# Patient Record
Sex: Female | Born: 1960 | Race: White | Hispanic: No | Marital: Married | Smoking: Never smoker
Health system: Southern US, Community
[De-identification: ages and names within clinical notes are randomized; demographics above are authoritative.]

## PROBLEM LIST (undated history)

## (undated) DIAGNOSIS — E7800 Pure hypercholesterolemia, unspecified: Secondary | ICD-10-CM

## (undated) DIAGNOSIS — I639 Cerebral infarction, unspecified: Secondary | ICD-10-CM

## (undated) DIAGNOSIS — E039 Hypothyroidism, unspecified: Secondary | ICD-10-CM

## (undated) DIAGNOSIS — N6019 Diffuse cystic mastopathy of unspecified breast: Secondary | ICD-10-CM

## (undated) DIAGNOSIS — G44009 Cluster headache syndrome, unspecified, not intractable: Secondary | ICD-10-CM

## (undated) DIAGNOSIS — M314 Aortic arch syndrome [Takayasu]: Secondary | ICD-10-CM

## (undated) HISTORY — DX: Cluster headache syndrome, unspecified, not intractable: G44.009

## (undated) HISTORY — PX: OTHER SURGICAL HISTORY: SHX169

## (undated) HISTORY — DX: Aortic arch syndrome (takayasu): M31.4

## (undated) HISTORY — DX: Diffuse cystic mastopathy of unspecified breast: N60.19

## (undated) HISTORY — DX: Hypothyroidism, unspecified: E03.9

## (undated) HISTORY — DX: Pure hypercholesterolemia, unspecified: E78.00

## (undated) HISTORY — DX: Cerebral infarction, unspecified: I63.9

---

## 1999-08-03 ENCOUNTER — Ambulatory Visit (HOSPITAL_COMMUNITY): Admission: RE | Admit: 1999-08-03 | Discharge: 1999-08-03 | Payer: Self-pay | Admitting: Family Medicine

## 1999-08-03 ENCOUNTER — Encounter: Payer: Self-pay | Admitting: Family Medicine

## 2004-07-20 ENCOUNTER — Encounter: Admission: RE | Admit: 2004-07-20 | Discharge: 2004-07-20 | Payer: Self-pay | Admitting: Family Medicine

## 2004-07-29 ENCOUNTER — Encounter: Admission: RE | Admit: 2004-07-29 | Discharge: 2004-07-29 | Payer: Self-pay | Admitting: Family Medicine

## 2005-03-30 ENCOUNTER — Ambulatory Visit (HOSPITAL_COMMUNITY): Admission: RE | Admit: 2005-03-30 | Discharge: 2005-03-30 | Payer: Self-pay | Admitting: Family Medicine

## 2005-07-21 ENCOUNTER — Encounter: Admission: RE | Admit: 2005-07-21 | Discharge: 2005-07-21 | Payer: Self-pay | Admitting: Gastroenterology

## 2005-08-29 ENCOUNTER — Encounter: Admission: RE | Admit: 2005-08-29 | Discharge: 2005-08-29 | Payer: Self-pay | Admitting: Dermatology

## 2005-11-16 ENCOUNTER — Ambulatory Visit (HOSPITAL_COMMUNITY): Admission: RE | Admit: 2005-11-16 | Discharge: 2005-11-16 | Payer: Self-pay | Admitting: Family Medicine

## 2006-06-15 ENCOUNTER — Other Ambulatory Visit: Admission: RE | Admit: 2006-06-15 | Discharge: 2006-06-15 | Payer: Self-pay | Admitting: Family Medicine

## 2006-06-17 ENCOUNTER — Encounter: Admission: RE | Admit: 2006-06-17 | Discharge: 2006-06-17 | Payer: Self-pay | Admitting: Family Medicine

## 2007-07-24 ENCOUNTER — Inpatient Hospital Stay (HOSPITAL_COMMUNITY): Admission: EM | Admit: 2007-07-24 | Discharge: 2007-07-31 | Payer: Self-pay | Admitting: Emergency Medicine

## 2007-07-27 ENCOUNTER — Encounter (INDEPENDENT_AMBULATORY_CARE_PROVIDER_SITE_OTHER): Payer: Self-pay | Admitting: Internal Medicine

## 2009-02-04 DIAGNOSIS — M314 Aortic arch syndrome [Takayasu]: Secondary | ICD-10-CM

## 2009-02-05 ENCOUNTER — Ambulatory Visit: Payer: Self-pay | Admitting: Cardiovascular Disease

## 2009-04-30 ENCOUNTER — Ambulatory Visit: Payer: Self-pay | Admitting: Vascular Surgery

## 2010-05-06 ENCOUNTER — Other Ambulatory Visit
Admission: RE | Admit: 2010-05-06 | Discharge: 2010-05-06 | Payer: Self-pay | Source: Home / Self Care | Admitting: Family Medicine

## 2010-09-18 ENCOUNTER — Encounter: Payer: Self-pay | Admitting: Cardiovascular Disease

## 2010-09-21 ENCOUNTER — Ambulatory Visit (INDEPENDENT_AMBULATORY_CARE_PROVIDER_SITE_OTHER): Payer: 59 | Admitting: Cardiovascular Disease

## 2010-09-21 ENCOUNTER — Encounter: Payer: Self-pay | Admitting: Cardiovascular Disease

## 2010-09-21 DIAGNOSIS — Z7901 Long term (current) use of anticoagulants: Secondary | ICD-10-CM

## 2010-09-21 DIAGNOSIS — M314 Aortic arch syndrome [Takayasu]: Secondary | ICD-10-CM

## 2010-09-21 DIAGNOSIS — Z5181 Encounter for therapeutic drug level monitoring: Secondary | ICD-10-CM

## 2010-09-21 DIAGNOSIS — I739 Peripheral vascular disease, unspecified: Secondary | ICD-10-CM

## 2010-09-21 DIAGNOSIS — R0989 Other specified symptoms and signs involving the circulatory and respiratory systems: Secondary | ICD-10-CM

## 2010-09-21 NOTE — Assessment & Plan Note (Addendum)
Long standing occlusion of carotid bypass.  Seen by Dr Arbie Cookey in past with no surgical options.  Primary cerebral flow is vertebral.  No cardioembolic risk.  Will do duplex to reassess circulation.  I feel that antiplatlet Rx would be better at stroke prevention and less dangerous in the long run than coumadin or one of the newer Xa inhibitors.

## 2010-09-21 NOTE — Progress Notes (Signed)
Angla is referred back by Dr. Laurann Montana.  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  She has Ao-carotid bypasses  at [facility] in 87 and 89 and has been on chronic coumdan.  As I recall her bypasses are occludied and she relies on one vertibral for her CNS circulation.  She has been seen by Dr. Arbie Cookey who essentially told her, that he wouldn't touch her case and she would need to be referred to an academic center if her symptoms worsened.  She had a bout of headaches requiring hospitalizaiton in 07/2007.  Earlier this year she had some self limited palpitations.  She actually points to her neck area when she describes her palpitations.  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  She has not had any SSCP, syncope, TIA or dyspnea.  She does not have known involvement of the distal Ao or legs.  Her left arm is more affected and she has mild steal symptoms in this arm  Long discussion with her regarding her long term coumadin.  Also discussed pradaxa and xeralto.  I think antiplatlet therapy would be a better antithrombotic and stroke prevention since her carotid grafts are occluded.  ROS: Denies fever, malais, weight loss, blurry vision, decreased visual acuity, cough, sputum, SOB, hemoptysis, pleuritic pain, palpitaitons, heartburn, abdominal pain, melena, lower extremity edema, claudication, or rash.   General: Affect appropriate Healthy:  appears stated age HEENT: normal Neck supple with no adenopathy JVP normal bilateral bruits with CEA scars  no thyromegaly Lungs clear with no wheezing and good diaphragmatic motion Heart:  S1/S2 no murmur,rub, gallop or click PMI normal Abdomen: benighn, BS positve, no tenderness, no AAA no bruit.  No HSM or HJR Distal pulses intact with no bruits No edema Neuro non-focal Skin warm and dry No muscular weakness   Current  Outpatient Prescriptions  Medication Sig Dispense Refill  . Multiple Vitamin (MULTIVITAMIN) tablet Take 1 tablet by mouth daily.        . Nutritional Supplements (SOY PROTEIN SHAKE) POWD Take by mouth 2 (two) times daily.        Marland Kitchen thyroid (ARMOUR THYROID) 60 MG tablet Take 60 mg by mouth daily.        Marland Kitchen warfarin (COUMADIN) 10 MG tablet Take 10 mg by mouth as directed.        Marland Kitchen DISCONTD: aspirin 81 MG tablet Take 81 mg by mouth daily.          Allergies  Shellfish-derived products  Electrocardiogram:  NSR 76 normal ECG  Assessment and Plan

## 2010-09-21 NOTE — Patient Instructions (Signed)
Your physician recommends that you schedule a follow-up appointment in: one year  Your physician has requested that you have a carotid duplex. This test is an ultrasound of the carotid arteries in your neck. It looks at blood flow through these arteries that supply the brain with blood. Allow one hour for this exam. There are no restrictions or special instructions.   Your physician has requested that you have a upper extremity arterial duplex. This test is an ultrasound of the arteries in the legs or arms. It looks at arterial blood flow in the legs and arms. Allow one hour for Lower and Upper Arterial scans. There are no restrictions or special instructions

## 2010-09-21 NOTE — Assessment & Plan Note (Signed)
Coumadin followed at Dr Aram Beecham [facility] office.  Reviewed records and INR;s Rx.  Since carotid bypass grafts are occluded and primarily dealing with stroke prevention with no history of cardioembolic source, PAF, or hypercoagulability will discuss with [facility] and [facility] about changing to Plavix or Effient instead

## 2010-10-07 ENCOUNTER — Other Ambulatory Visit (INDEPENDENT_AMBULATORY_CARE_PROVIDER_SITE_OTHER): Payer: 59 | Admitting: *Deleted

## 2010-10-07 ENCOUNTER — Encounter (INDEPENDENT_AMBULATORY_CARE_PROVIDER_SITE_OTHER): Payer: 59 | Admitting: *Deleted

## 2010-10-07 ENCOUNTER — Encounter: Payer: 59 | Admitting: *Deleted

## 2010-10-07 DIAGNOSIS — I6529 Occlusion and stenosis of unspecified carotid artery: Secondary | ICD-10-CM

## 2010-10-07 DIAGNOSIS — R0989 Other specified symptoms and signs involving the circulatory and respiratory systems: Secondary | ICD-10-CM

## 2010-10-07 DIAGNOSIS — I739 Peripheral vascular disease, unspecified: Secondary | ICD-10-CM

## 2010-10-08 ENCOUNTER — Encounter: Payer: Self-pay | Admitting: Cardiovascular Disease

## 2010-10-12 NOTE — H&P (Signed)
Alyssa Yoder, Alyssa Yoder               ACCOUNT NO.:  [id number]   MEDICAL RECORD NO.:  [id number]          PATIENT TYPE:  INP   LOCATION:  1832                         FACILITY:  [facility]   PHYSICIAN:  Hollice Espy, M.D.DATE OF BIRTH:  1961/03/23   DATE OF ADMISSION:  07/24/2007  DATE OF DISCHARGE:                              HISTORY & PHYSICAL   PRIMARY CARE PHYSICIAN:  Bayla Mcgovern. White, M.D.   CHIEF COMPLAINT:  Headache.   HISTORY OF PRESENT ILLNESS:  The patient is a 50 year old white female  with a past medical history of Takayasu arteritis which is a chronic  autoimmune condition leading to inflammation of the aorta and its  surrounding branches which she has had in the past.  She has suffered  stenoses of her carotid artery leading to a bicarotid aorto-bypass as  well as leading to--she has only one patent vertebral posterior  circulation artery requiring chronic anticoagulation.  The patient has  otherwise been stable.  She had a brief episode of superior mesenteric  ischemia, but with collateral circulation, this did not require an  intervention.  Otherwise, she has been stable with no previous headaches  or severe flareups for the past 15 years.  However for the last 4 days,  the patient has had severe headaches described as how she previously  felt 15 years ago with severe pain in the top of her head and a pulsing  sensation occurring every 6 hours.  She was started on p.o. Darvocet  which initially helped with the pain.  She tried to wean herself off the  pain medication, but then her headaches returned severely today when she  tried to stop the pain medication all together.  The patient underwent a  CT scan of the head as an outpatient.  While we do not have access to  these films, the PCP passed on to the family that it was normal.  However, the headaches persisted and the patient came into the emergency  room today.  Her vitals are stable on admission.  When she came  in, she  had lab checks drawn, INR was found to be therapeutic.  Her white count  was found to be normal with no shift.  However, her transaminases were  mildly elevated.  The patient had passed on to the emergency room PA  that she had taken quite a bit of Percocet and Tylenol in the last few  days because of her headache.  There was a concern about the possibility  of too much Tylenol.  The patient currently complains of a headache and  photophobia.  She denies any vision changes or dysphagia.  No chest  pain, palpitations, shortness of breath, wheeze, cough.  No abdominal  pain.  No hematuria, dysuria, constipation, diarrhea, focal extremity  numbness, weakness or pain.   REVIEW OF SYSTEMS:  Otherwise negative.   PAST MEDICAL HISTORY:  1. Takayasu arteritis, status post aorto-bicarotid bypass, solo patent      vertebro-posterior circulation artery.  2. 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   MEDICATIONS:  1. Darvocet p.r.n.  2. Coumadin 10 p.o. q.h.s.   ALLERGIES:  SHELLFISH.   SOCIAL HISTORY:  No tobacco, alcohol or drug use.   FAMILY HISTORY:  Noncontributory.   PHYSICAL EXAMINATION:  VITAL SIGNS:  Temperature 98.4, heart rate 84,  blood pressure 117/82, respirations 20, O2 sat 100% on room air.  GENERAL:  She is alert and oriented x 3 in some mild distress secondary  to photophobia and headache.  HEENT:  Normocephalic, atraumatic.  Her mucous membranes are slightly  dry.  NECK:  She has absent carotid pulses.  HEART:  Regular rate and rhythm, S1 and S2.  LUNGS:  Clear to auscultation bilaterally.  ABDOMEN:  Soft, nontender and nondistended with positive bowel sounds.  EXTREMITIES:  Show no clubbing, cyanosis, or edema.   DIAGNOSTICS:  Again, we do not have access to her current CT scan.   LABORATORY DATA:  Her INR is therapeutic at 2.5, white count 8.9, H&H  14.8 and 43, MCV 87, platelet count 206 no shift, sodium 132, potassium  16,  chloride 96, bicarb 27, BUN 7, creatinine 0.6, glucose 108.  LFTs  are noted for a normal albumin of 4.4, but with an AST of 66, ALT  slightly elevated as well.  I have ordered a sed rate, CRP and Tylenol  level all of which are pending.   ASSESSMENT/PLAN:  1. Headache, await CRP and ESR, but suspect this is a flareup of      Takayasu arteritis.  Start with daily prednisone 60 mg.  The      patient has outpatient CT done.  We will discuss with her PCP.  If      this is not a CT angio, we will go ahead and order that here.  This      will give [place] a better evaluation of possible stenoses.  2. History of Takayasu arteritis, see above.  3. Chronic anticoagulation for solo vertebral artery, to keep it      patent, we will continue Coumadin.  4. Some mild increase in liver function tests in setting of recent      consumption of significant amounts of Tylenol, we will check a      Tylenol level.      Hollice Espy, M.D.  Electronically Signed     SKK/MEDQ  D:  07/24/2007  T:  07/24/2007  Job:  [id number]   cc:   Stacie Acres. Cliffton Asters, M.D.

## 2010-10-12 NOTE — Consult Note (Signed)
NAMEJAEDAN, Alyssa Yoder               ACCOUNT NO.:  [id number]   MEDICAL RECORD NO.:  [id number]          PATIENT TYPE:  INP   LOCATION:  3030                         FACILITY:  [facility]   PHYSICIAN:  Bevelyn Buckles. Champey, M.D.DATE OF BIRTH:  [birth year]   DATE OF CONSULTATION:  DATE OF DISCHARGE:                                 CONSULTATION   REQUESTING PHYSICIAN:  Dr. Janee Morn.   REASON FOR CONSULT:  Headache/aneurysm.   HISTORY OF PRESENT ILLNESS:  Alyssa Yoder is a 50 year old Caucasian female  with a past medical history of arteritis with a history of bilateral  carotid grafts and diseased posterior circulation requiring chronic  anticoagulation.  The patient has been stable for over 15 years without  any headaches or flares.  The patient did present to the hospital with a  few-day history of headache.  Her headaches are fairly severe, described  as throbbing, sharp, mostly on the top of the head, associated with  nausea, photophobia and phonophobia.  Her headache is now resolved with  pain medication/treatment.  Initially with that she had some difficulty  with speech, however, this is no longer an issue as her headache has  resolved.  She denies any current weakness, numbness, vision changes,  swallowing problems, chewing problems, vertigo, falls, or loss of  consciousness.  The patient did take fairly regularly Tylenol and  Percocet a few days prior to admission for her pain.  Her headache did  not completely resolve.   PAST MEDICAL HISTORY:  Is positive for arteritis, history of superior  mesenteric ischemia.   CURRENT MEDICATIONS:  Include Darvocet p.r.n., Coumadin, prednisone, and  Asacol.   ALLERGIES:  SHELLFISH.   FAMILY HISTORY:  Noncontributory.   SOCIAL HISTORY:  The patient denies any tobacco, alcohol, or drug use.   REVIEW OF SYSTEMS:  Positive as per HPI.  Negative in greater than 6  systems   REVIEW OF SYSTEMS:  Negative as per HPI  and greater than 6 other organ  systems.   PHYSICAL EXAMINATION:  VITAL SIGNS:  Temperature is 97.5, blood pressure  104/65, pulse is 64, respirations 20, 02 saturation is 100%.  HEENT:  Normocephalic, atraumatic.  Extraocular muscles are intact.  Face is symmetric.  HEART:  Regular.  LUNGS:  Clear.  ABDOMEN:  Soft.  EXTREMITIES:  Show good pulses.  NEUROLOGIC:  The patient is awake, alert and oriented.  Language is  fluent.  The patient is following commands verbally.  Cranial nerves 2-  12 are grossly intact.  Motor examination revealed 5/5 strength and  normal tone in all 4 extremities.  No drift is noted.  Sensory  examination is within normal to light touch.  Reflexes are 1-2+ and  symmetric.  Cerebellar function is within normal limits.  Gait was not  assessed secondary to safety.   LABORATORY DATA:  WBC is 11, hemoglobin is 14.6, hematocrit is 42.1,  platelets 186.  PT is 24.9, INR is 2.2.  Sodium is 134, potassium is  3.8, chloride is 100, and C02 is 26, BUN 9, creatinine 07, glucose 91.  AST is 49, ALT is  64.  Calcium is 9.  CRP is 0.2.  ESR is 5.  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  MRA of the neck showed absent flow in the  left subclavian, left ICA, and nonvisualization of the left vertebral  artery origin of the dominant right vertebral artery.   IMPRESSION:  This is a 50 year old Caucasian female with arteritis who  presents with a headache which is now completely resolved.  I reviewed  the MRI/MRA with radiology and patient.  Some of these findings seem old  and could be older chronic findings.  This patient knew several of the  abnormalities as discussed.  Will  try and get the old studies from the  [facility] the patient had several years ago.  I agree with the patient  on prednisone for now and I recommend slowly tapering the patient and  probably keep the patient on low-dose prednisone for her arteritis.  Her  symptoms are not completely resolved.  The patient might need a cerebral  angiogram depending on the results of her old studies.  This can be done  in the future.  Recommend continuing the patient on Coumadin and GI  prophylaxis.  The description of her headaches seems also suggestive of  migraine headaches.  If these continue or persist, will consider a  migraine preventive agent in the future.  I will follow the patient  while she is in the hospital.  This case was discussed with the primary  service.      Bevelyn Buckles. Nash Shearer, M.D.  Electronically Signed     DRC/MEDQ  D:  07/25/2007  T:  07/25/2007  Job:  [id number]

## 2010-10-12 NOTE — Discharge Summary (Signed)
Alyssa Yoder, Alyssa Yoder               ACCOUNT NO.:  [id number]   MEDICAL RECORD NO.:  [id number]          PATIENT TYPE:  INP   LOCATION:  3030                         FACILITY:  [facility]   PHYSICIAN:  Hollice Espy, M.D.DATE OF BIRTH:  02-22-1961   DATE OF ADMISSION:  07/23/2007  DATE OF DISCHARGE:  07/31/2007                               DISCHARGE SUMMARY   PRIMARY CARE PHYSICIAN:  Arnita Koons. White, MD   CONSULTANTS:  Pramod P. Pearlean Brownie, MD   DISCHARGE DIAGNOSES:  1. Refractive vascular headache.  2. History of  Takayasu's arteritis.   DISCHARGE MEDICATIONS:  1. Prednisone taper 50 mg August 01, 2007, then 40 mg p.o. on August 02, 2007 and decrease by 10 mg daily until finished.  2. Topamax 25 p.o. b.i.d.  3. Colace 100 p.o. b.i.d. while on Dilaudid.  4. Elavil 25 mg p.o. nightly.  5. Depakote 500 mg p.o. b.i.d.  6. Laxative of choice if no bowel movement x24 hours.  7. The patient will continue on Coumadin 10 mg daily.  8. She is also given a prescription for Dilaudid 2 mg 1 p.o. q.4 h.      p.r.n., total #50, 50 only.   DISCHARGE INSTRUCTIONS:  1. Discharge diet will be regular diet.  2. Activity will be as tolerated.  3. Followup appointment, she will followup with Dr. Delia Heady in      the next 1 week.  She will followup with Dr. Laurann Montana in the      next 1-2 weeks.   Overall disposition improved.   HOSPITAL COURSE:  The patient is a 50 year old white female with past  medical history of Takayasu's arteritis, who is found to have only 1  posterior circulation vertebral artery and requires chronic  anticoagulation.  She has actually been well without any flare ups for  the past 15 years, and then sort of having a severe, unrelenting  headache for 4 days.  She became concerned and came into the emergency  room.  In the emergency room she underwent a CT scan of the head that  showed no evidence of acute bleed or stroke.  However, given her severe  symptoms,  there is concern that whether or not this is a continued flare  up.  She was started on prednisone prophylactically, as well as had an  MRI/MRA done.  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  Dr. Clemetine Marker from neurology evaluated the patient, and after discussion  with radiology, as well as reviewing her old records from the [facility], he felt that her MRI findings were secondary to her arteritis,  however, he did not significantly feel that her headache was directly  attributed to this.  He then continued to watch her and  then after  discussion with the patient and further evaluation, felt that she more  likely had severe refractory headache, not necessarily migraine, perhaps  vascular, given the fact that she only had 1 patent vertebral artery.  He recommended continuing on prednisone, tapering off.  He added  Topamax, Elavail, and Depakote.  Over the next several days she  continued to improve and she was still requiring p.r.n. Dilaudid by pain  medication.  However, her episodes were starting to become more and more  intermittent and finally by July 30, 2007 she has had a full 24 hours  period where she did not have a headache.  At this time it was felt that  she was stable to go home.   The patient will be discharged on a prednisone taper.  She will continue  all of her other medications and have followup appointments as  scheduled.      Hollice Espy, M.D.  Electronically Signed     SKK/MEDQ  D:  07/31/2007  T:  07/31/2007  Job:  [id number]   cc:   Pramod P. Pearlean Brownie, MD  Stacie Acres. Cliffton Asters, M.D.

## 2010-10-20 ENCOUNTER — Telehealth: Payer: Self-pay | Admitting: Cardiology

## 2010-10-20 DIAGNOSIS — I771 Stricture of artery: Secondary | ICD-10-CM

## 2010-10-20 NOTE — Telephone Encounter (Signed)
Per Dr. [name], order put in for MRA of upper extremities, carotids, and intracerebral circulation. Patient will need BMP prior to test.

## 2010-10-20 NOTE — Telephone Encounter (Signed)
New order put in for MRA of head without contrast.

## 2010-10-21 ENCOUNTER — Other Ambulatory Visit: Payer: Self-pay | Admitting: *Deleted

## 2010-10-21 ENCOUNTER — Other Ambulatory Visit: Payer: Self-pay | Admitting: Cardiovascular Disease

## 2010-10-21 DIAGNOSIS — I708 Atherosclerosis of other arteries: Secondary | ICD-10-CM

## 2010-10-27 ENCOUNTER — Other Ambulatory Visit: Payer: Self-pay | Admitting: Cardiovascular Disease

## 2010-10-27 ENCOUNTER — Other Ambulatory Visit: Payer: Self-pay | Admitting: *Deleted

## 2010-10-27 DIAGNOSIS — R51 Headache: Secondary | ICD-10-CM

## 2010-10-27 DIAGNOSIS — R002 Palpitations: Secondary | ICD-10-CM

## 2010-10-28 ENCOUNTER — Inpatient Hospital Stay (HOSPITAL_COMMUNITY): Admission: RE | Admit: 2010-10-28 | Payer: 59 | Source: Ambulatory Visit

## 2010-12-13 ENCOUNTER — Telehealth: Payer: Self-pay | Admitting: Cardiovascular Disease

## 2010-12-13 NOTE — Telephone Encounter (Signed)
Pt will be running out of Coumadin in two weeks, pt also currently has no insurance. Pt is in the process of looking for insurance coverage. Pt was wondering if Dr. [name] can prescribe a different medication that has the same benefits that would be affordable w/o insurance. Pt is unable to come in for an appt due to lack of insurance. Dr. [name] was going to contact [facility]'s, they know most about the rare disease pt has that clogs up arteries.  Pt had to cancel procedures Dr. [name] ordered due to death in family and financial troubles.   Please return pt call to advise.

## 2010-12-13 NOTE — Telephone Encounter (Signed)
Left message for pt, will forward to dr [name] for his review and recommendations Alyssa Yoder

## 2010-12-14 NOTE — Telephone Encounter (Signed)
Coumadin is the cheapest anticoagulant we have.  But needs to be monitored

## 2010-12-14 NOTE — Telephone Encounter (Signed)
My note indicates antiplatelt may be better than coumadin.  See if she can take asa and generic plavix.  Stop coumadin if she can

## 2010-12-14 NOTE — Telephone Encounter (Signed)
Spoke with pt, she is on the coumadin to help with the circulation in her carotid bypass but they are now occluded. She wonders if she can come off the coumadin or if there is something else that will keep the arteries from being so sticky. She is having her coumadin followed. Will forward to dr Verdis Prime

## 2010-12-15 MED ORDER — CLOPIDOGREL BISULFATE 75 MG PO TABS: 75.0000 mg | ORAL_TABLET | Freq: Every day | ORAL | Status: DC

## 2010-12-15 NOTE — Telephone Encounter (Signed)
Spoke with pt, she would like to switch to asa and plavix. Script called to pharm. She will stop her coumadin and start the plavix Alyssa Yoder

## 2011-02-18 LAB — CBC
HCT: 42.1
HCT: 44
Hemoglobin: 13.4
MCHC: 34
MCHC: 34.6
MCV: 87
MCV: 88.4
Platelets: 162
Platelets: 229
RBC: 4.48
RBC: 4.76
RBC: 4.88
RBC: 4.98
RDW: 13
WBC: 11 — ABNORMAL HIGH
WBC: 11.5 — ABNORMAL HIGH
WBC: 8.9
WBC: 9.3

## 2011-02-18 LAB — PROTIME-INR
INR: 2.4 — ABNORMAL HIGH
INR: 2.5 — ABNORMAL HIGH
INR: 2.5 — ABNORMAL HIGH
INR: 4.3 — ABNORMAL HIGH
Prothrombin Time: 27.2 — ABNORMAL HIGH
Prothrombin Time: 27.7 — ABNORMAL HIGH
Prothrombin Time: 28 — ABNORMAL HIGH
Prothrombin Time: 35.3 — ABNORMAL HIGH
Prothrombin Time: 43.1 — ABNORMAL HIGH

## 2011-02-18 LAB — COMPREHENSIVE METABOLIC PANEL
BUN: 9
CO2: 26
CO2: 27
Calcium: 9
Calcium: 9.3
Chloride: 100
Chloride: 96
Creatinine, Ser: 0.63
Creatinine, Ser: 0.7
GFR calc Af Amer: >60
GFR calc non Af Amer: >60
GFR calc non Af Amer: >60
Glucose, Bld: 108 — ABNORMAL HIGH
Total Bilirubin: 0.6
Total Bilirubin: 0.8

## 2011-02-18 LAB — BASIC METABOLIC PANEL
BUN: 11
BUN: 9
Calcium: 9.3
Calcium: 9.4
Creatinine, Ser: 0.7
GFR calc Af Amer: >60
GFR calc Af Amer: >60
GFR calc non Af Amer: >60
GFR calc non Af Amer: >60
GFR calc non Af Amer: >60
Glucose, Bld: 94
Potassium: 3.9
Sodium: 137

## 2011-02-18 LAB — C4 COMPLEMENT: Complement C4, Body Fluid: 21

## 2011-02-18 LAB — C-REACTIVE PROTEIN: CRP: 0.2 — ABNORMAL LOW (ref <0.6)

## 2011-02-18 LAB — DIFFERENTIAL
Basophils Absolute: 0
Eosinophils Absolute: 0
Eosinophils Relative: 1
Lymphocytes Relative: 20
Lymphs Abs: 1.8
Neutrophils Relative %: 72

## 2011-02-18 LAB — COMPLEMENT, TOTAL: Compl, Total (CH50): 41 U/mL (ref 31–66)

## 2011-02-18 LAB — C3 COMPLEMENT: C3 Complement: 153

## 2011-02-18 LAB — METANEPHRINES, URINE, 24 HOUR: Volume, Urine-METAN: 3650

## 2011-02-18 LAB — CATECHOLAMINES, FRACTIONATED, URINE, 24 HOUR
Epinephrine 24 Hr Urine: 7 ug/24hr (ref <20)
Norepinephrine 24 Hr Urine: 55 ug/24hr (ref <80)
Total urine volume: 3650 mL

## 2011-02-18 LAB — VMA + CREATININE, URINE (TIMED COLLECTION): Volume, Urine-VMAUR: 3650

## 2011-02-18 LAB — ACETAMINOPHEN LEVEL: Acetaminophen (Tylenol), Serum: <10 — ABNORMAL LOW

## 2011-02-21 LAB — BASIC METABOLIC PANEL
BUN: 9
CO2: 30
Calcium: 9
Chloride: 95 — ABNORMAL LOW
GFR calc Af Amer: >60
GFR calc Af Amer: >60
GFR calc non Af Amer: >60
Potassium: 3.6
Potassium: 3.7
Sodium: 137
Sodium: 138

## 2011-02-21 LAB — CBC
HCT: 42
HCT: 44.3
Hemoglobin: 14.4
Hemoglobin: 15.1 — ABNORMAL HIGH
MCHC: 34.2
Platelets: 221
RBC: 4.74
RBC: 5.04
WBC: 16 — ABNORMAL HIGH

## 2011-02-21 LAB — PROTIME-INR
INR: 2.4 — ABNORMAL HIGH
Prothrombin Time: 21.8 — ABNORMAL HIGH

## 2011-10-13 ENCOUNTER — Telehealth: Payer: Self-pay | Admitting: Cardiovascular Disease

## 2011-10-13 NOTE — Telephone Encounter (Signed)
New Problem:     I called the patient and was unable to reach them. I left a message on their voicemail with my name, the reason I called, the name of his physician, and a number to call back to schedule their appointment. 

## 2011-12-22 ENCOUNTER — Other Ambulatory Visit: Payer: Self-pay | Admitting: Cardiovascular Disease

## 2012-08-10 ENCOUNTER — Other Ambulatory Visit: Payer: Self-pay | Admitting: Family Medicine

## 2012-08-10 DIAGNOSIS — Z1211 Encounter for screening for malignant neoplasm of colon: Secondary | ICD-10-CM

## 2012-10-08 ENCOUNTER — Ambulatory Visit
Admission: RE | Admit: 2012-10-08 | Discharge: 2012-10-08 | Disposition: A | Discharge status: Home / Self Care | Payer: BC Managed Care – PPO | Source: Ambulatory Visit | Attending: Family Medicine | Admitting: Family Medicine

## 2012-10-08 DIAGNOSIS — Z1211 Encounter for screening for malignant neoplasm of colon: Secondary | ICD-10-CM

## 2012-12-11 ENCOUNTER — Other Ambulatory Visit: Payer: Self-pay | Admitting: *Deleted

## 2012-12-11 MED ORDER — CLOPIDOGREL BISULFATE 75 MG PO TABS: ORAL_TABLET | ORAL | Status: DC

## 2013-04-26 ENCOUNTER — Ambulatory Visit (INDEPENDENT_AMBULATORY_CARE_PROVIDER_SITE_OTHER): Payer: BC Managed Care – PPO | Admitting: Cardiovascular Disease

## 2013-04-26 ENCOUNTER — Encounter: Payer: Self-pay | Admitting: Cardiovascular Disease

## 2013-04-26 ENCOUNTER — Encounter (INDEPENDENT_AMBULATORY_CARE_PROVIDER_SITE_OTHER): Payer: Self-pay

## 2013-04-26 ENCOUNTER — Encounter: Payer: Self-pay | Admitting: General Surgery

## 2013-04-26 VITALS — BP 110/84 | HR 76 | Ht 69.0 in | Wt 179.0 lb

## 2013-04-26 DIAGNOSIS — Z5181 Encounter for therapeutic drug level monitoring: Secondary | ICD-10-CM

## 2013-04-26 DIAGNOSIS — R079 Chest pain, unspecified: Secondary | ICD-10-CM

## 2013-04-26 DIAGNOSIS — R0600 Dyspnea, unspecified: Secondary | ICD-10-CM

## 2013-04-26 DIAGNOSIS — R0789 Other chest pain: Secondary | ICD-10-CM | POA: Insufficient documentation

## 2013-04-26 DIAGNOSIS — Z7901 Long term (current) use of anticoagulants: Secondary | ICD-10-CM

## 2013-04-26 DIAGNOSIS — M314 Aortic arch syndrome [Takayasu]: Secondary | ICD-10-CM

## 2013-04-26 DIAGNOSIS — R0609 Other forms of dyspnea: Secondary | ICD-10-CM

## 2013-04-26 NOTE — Patient Instructions (Signed)
Your physician recommends that you continue on your current medications as directed. Please refer to the Current Medication list given to you today.  I called Dr. Soundra Pilon office and left a message with their nurse stating we needed the pt to be seen ASAP. If you have not heard from them by Weds of next week please call our office.   Your physician has requested that you have an exercise tolerance test. For further information please visit [email or web address] Please also follow instruction sheet, as given.  Your physician has requested that you have an echocardiogram. Echocardiography is a painless test that uses sound waves to create images of your heart. It provides your doctor with information about the size and shape of your heart and how well your heart's chambers and valves are working. This procedure takes approximately one hour. There are no restrictions for this procedure.  Your physician wants you to follow-up in: 6 Months with Dr Haywood Filler will receive a reminder letter in the mail two months in advance. If you don't receive a letter, please call our office to schedule the follow-up appointment.

## 2013-04-26 NOTE — Assessment & Plan Note (Signed)
Continue antiplatlet Rx.  F/U Dr Arbie Cookey Suspect there is still no surgical intervention but may help with disability claim

## 2013-04-26 NOTE — Assessment & Plan Note (Signed)
Seems functional Normal lung and heart exam F/U echo

## 2013-04-26 NOTE — Progress Notes (Signed)
Patient ID: JO BOOZE, female   DOB: 11/29/1960, 52 y.o.   MRN: [id number] Alyssa Yoder is referred back by Dr. Laurann Montana. 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 She has Ao-carotid bypasses at [facility] in 89 and 93 and has been on chronic coumdan. 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 She has been seen by Dr. Arbie Cookey who essentially told her, that he wouldn't touch her case and she would need to be referred to an academic center if her symptoms worsened. She had a bout of headaches requiring hospitalizaiton in 07/2007. Earlier this year she had some self limited palpitations. She actually points to her neck area when she describes her palpitations. 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 She has not had any SSCP, syncope, TIA or dyspnea. She does not have known involvement of the distal Ao or legs. 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  2. 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  3. Dominant right vertebral artery as described above.    Has more fatigue and dyspnea. Feels head pounding. Unable to work going through disability process.  Would like to see Dr Early again.  No focal neurologic signs. Occasional chest pressure with dyspnea.    ROS: Denies fever, malais, weight loss, blurry vision, decreased visual acuity, cough, sputum, SOB, hemoptysis, pleuritic pain, palpitaitons, heartburn, abdominal pain, melena, lower extremity  edema, claudication, or rash.  All other systems reviewed and negative  General: Decreased BP in left arm and pulse in radial on left  Affect appropriate Healthy:  appears stated age HEENT: normal Neck supple with no adenopathy JVP normal bilateral bruits with surgical scars in neck no thyromegaly Lungs clear with no wheezing and good diaphragmatic motion Heart:  S1/S2 no murmur, no rub, gallop or click PMI normal Abdomen: benighn, BS positve, no tenderness, no AAA no bruit.  No HSM or HJR Distal pulses intact with no bruits No edema Neuro non-focal Skin warm and dry No muscular weakness   Current Outpatient Prescriptions  Medication Sig Dispense Refill  . aspirin 325 MG tablet Take 325 mg by mouth daily.      . clopidogrel (PLAVIX) 75 MG tablet TAKE 1 TABLET BY MOUTH EVERY DAY  30 tablet  10  . Multiple Vitamin (MULTIVITAMIN) tablet Take 1 tablet by mouth daily.        . Nutritional Supplements (SOY PROTEIN SHAKE) POWD Take by mouth 2 (two) times daily.        Marland Kitchen thyroid (ARMOUR THYROID) 60 MG tablet Take 60 mg by mouth daily.         No current facility-administered medications for this visit.    Allergies  Shellfish-derived products  Electrocardiogram:  SR Rate 76 normal   Assessment and Plan

## 2013-04-26 NOTE — Assessment & Plan Note (Signed)
Previous great vessel grafts known to be occluded Anticoagulation stopped and now on antiplatlet Rx

## 2013-04-26 NOTE — Assessment & Plan Note (Signed)
Normal ECG  F/U ETT no previously documented CAD.  Seems more related to ? Depression and issues with disability

## 2013-04-29 ENCOUNTER — Other Ambulatory Visit: Payer: Self-pay | Admitting: *Deleted

## 2013-04-29 DIAGNOSIS — M314 Aortic arch syndrome [Takayasu]: Secondary | ICD-10-CM

## 2013-05-10 ENCOUNTER — Ambulatory Visit (HOSPITAL_COMMUNITY)
Admission: RE | Admit: 2013-05-10 | Discharge: 2013-05-10 | Disposition: A | Discharge status: Home / Self Care | Payer: BC Managed Care – PPO | Source: Ambulatory Visit | Attending: Cardiovascular Disease | Admitting: Cardiovascular Disease

## 2013-05-10 DIAGNOSIS — R0609 Other forms of dyspnea: Secondary | ICD-10-CM

## 2013-05-10 DIAGNOSIS — R0600 Dyspnea, unspecified: Secondary | ICD-10-CM

## 2013-05-10 DIAGNOSIS — R079 Chest pain, unspecified: Secondary | ICD-10-CM

## 2013-05-10 DIAGNOSIS — I10 Essential (primary) hypertension: Secondary | ICD-10-CM | POA: Insufficient documentation

## 2013-05-13 ENCOUNTER — Encounter: Payer: Self-pay | Admitting: Cardiovascular Disease

## 2013-05-13 ENCOUNTER — Ambulatory Visit (HOSPITAL_COMMUNITY): Payer: BC Managed Care – PPO | Attending: Cardiovascular Disease | Admitting: Cardiology

## 2013-05-13 DIAGNOSIS — R079 Chest pain, unspecified: Secondary | ICD-10-CM

## 2013-05-13 DIAGNOSIS — R0600 Dyspnea, unspecified: Secondary | ICD-10-CM

## 2013-05-13 DIAGNOSIS — R0609 Other forms of dyspnea: Secondary | ICD-10-CM | POA: Insufficient documentation

## 2013-05-13 DIAGNOSIS — R0989 Other specified symptoms and signs involving the circulatory and respiratory systems: Secondary | ICD-10-CM | POA: Insufficient documentation

## 2013-05-13 DIAGNOSIS — R0602 Shortness of breath: Secondary | ICD-10-CM

## 2013-05-13 NOTE — Progress Notes (Signed)
Echo performed. 

## 2013-05-31 ENCOUNTER — Other Ambulatory Visit: Payer: Self-pay | Admitting: Vascular Surgery

## 2013-06-10 ENCOUNTER — Encounter: Payer: Self-pay | Admitting: Vascular Surgery

## 2013-06-11 ENCOUNTER — Ambulatory Visit (HOSPITAL_COMMUNITY)
Admission: RE | Admit: 2013-06-11 | Discharge: 2013-06-11 | Disposition: A | Discharge status: Home / Self Care | Payer: BC Managed Care – PPO | Source: Ambulatory Visit | Attending: Vascular Surgery | Admitting: Vascular Surgery

## 2013-06-11 ENCOUNTER — Encounter: Payer: Self-pay | Admitting: Vascular Surgery

## 2013-06-11 ENCOUNTER — Ambulatory Visit (INDEPENDENT_AMBULATORY_CARE_PROVIDER_SITE_OTHER): Payer: BC Managed Care – PPO | Admitting: Vascular Surgery

## 2013-06-11 VITALS — BP 110/76 | HR 68 | Resp 18 | Ht 68.0 in | Wt 182.5 lb

## 2013-06-11 DIAGNOSIS — M314 Aortic arch syndrome [Takayasu]: Secondary | ICD-10-CM | POA: Insufficient documentation

## 2013-06-11 NOTE — Progress Notes (Signed)
Patient name: Alyssa Yoder MRN: [id number] DOB: 06/06/1960 Sex: female   Referred by: Alyssa Yoder  Reason for referral:  Chief Complaint  Patient presents with  . New Evaluation    referred by Alyssa Yoder disease  involving upper extremities and carotids  . PVD    HISTORY OF PRESENT ILLNESS: The patient is a very pleasant 53 year old female who had seen on 2 prior occasions in 2005 in 2007. She has an extensive past medical history of Takayasu's disease. She had undergone extensive revascularization at the [facility]. I do not have these Alyssa Yoder that she had undergone bilateral carotid reconstruction [place] and redo surgery at the [facility] in 1993. She portion suffered an Alyssa Yoder reocclusion of work done in the [facility] after the 1993 procedure. 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 She doesn't have a known left subclavian artery occlusion. Majority of her intracranial circulation is via the right vertebral artery. Despite this she has remained stable for many years. She had been able to maintain usual activities up until approximately 5 years ago. Since that time she has had a severe difficulty with fatigue and headaches. Worse and she's had foot no focal neurologic deficits. 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 She does have good collateralization of flow to her mesenteric. She also portion has had no evidence of lower extremity arterial occlusive disease. Past Medical History  Diagnosis Date  . Takayasu's arteritis   . Mesenteric ischemia   . Stroke     Past Surgical History  Procedure Laterality Date  . Reconstruction of carotid arteries Bilateral 1989, 1993    History   Social History  . Marital Status: Married    Spouse Name: N/A    Number of Children: N/A  . Years of Education: N/A   Occupational History  . Not on file.   Social  History Main Topics  . Smoking status: Never Smoker   . Smokeless tobacco: Not on file  . Alcohol Use: No  . Drug Use: No  . Sexual Activity: Not on file   Other Topics Concern  . Not on file   Social History Narrative  . No narrative on file    History reviewed. No pertinent family history.  Allergies as of 06/11/2013 - Review Complete 06/11/2013  Allergen Reaction Noted  . Shellfish-derived products  09/21/2010    Current Outpatient Prescriptions on File Prior to Visit  Medication Sig Dispense Refill  . aspirin 325 MG tablet Take 325 mg by mouth daily.      . clopidogrel (PLAVIX) 75 MG tablet TAKE 1 TABLET BY MOUTH EVERY DAY  30 tablet  10  . Multiple Vitamin (MULTIVITAMIN) tablet Take 2 tablets by mouth daily.       . Nutritional Supplements (SOY PROTEIN SHAKE) POWD Take by mouth 2 (two) times daily.        Marland Kitchen thyroid (ARMOUR THYROID) 60 MG tablet Take 90 mg by mouth daily.        No current facility-administered medications on file prior to visit.     REVIEW OF SYSTEMS:  Positives indicated with an "X"  CARDIOVASCULAR:  [x ] chest pain   [ ]  chest pressure   [ ]  palpitations   [ ]  orthopnea   [ ]  dyspnea on exertion   [ ]  claudication   [ ]  rest pain   [ ]   DVT   [ ]  phlebitis PULMONARY:   [ ]  productive cough   [ ]  asthma   [ ]  wheezing NEUROLOGIC:   x] weakness  [ x] paresthesias  [ ]  aphasia  [x ] amaurosis  [x ] dizziness HEMATOLOGIC:   [ ]  bleeding problems   [ ]  clotting disorders MUSCULOSKELETAL:  [ ]  joint pain   [ ]  joint swelling GASTROINTESTINAL: [ ]   blood in stool  [ ]   hematemesis GENITOURINARY:  [ ]   dysuria  [ ]   hematuria PSYCHIATRIC:  [ ]  history of major depression INTEGUMENTARY:  [ ]  rashes  [ ]  ulcers CONSTITUTIONAL:  [ ]  fever   [ ]  chills  PHYSICAL EXAMINATION:  General: The patient is a well-nourished female, in no acute distress. Vital signs are BP 110/76  Pulse 68  Resp 18  Ht 5\' 8"  (1.727 m)  Wt 182 lb 8 oz (82.781 kg)  BMI 27.76  kg/m2 Pulmonary: There is a good air exchange bilaterally without wheezing or rales. Abdomen: Soft and non-tender with normal pitch bowel sounds. No bruits are noted Musculoskeletal: There are no major deformities.  There is no significant extremity pain. Neurologic: No focal weakness or paresthesias are detected, Skin: There are no ulcer or rashes noted. Psychiatric: The patient has normal affect. Cardiovascular: There is a regular rate and rhythm without significant murmur appreciated. Bilateral carotid incisions. L bruit on the right a soft bruit on the left. I do not hear subclavian bruits Pulse status 2+ right radial faint left radial pulse.  Palpable femoral popliteal and dorsalis pedis pulses bilaterally   Impression and Plan:  Complex patient with prior bilateral carotid reconstructions. A portion she has been able to maintain some quality-of-life. She suffers Alyssa Yoder fatigue in the afternoon and this requires her lying supine or approximate one hour I giving her some relief. She does have chronic headaches which are worse with exertion. I again explained that since she has not had any focal neurologic deficits, I certainly would not work recommend any invasive evaluation or treatment. She went to have limited ability for reconstruction due to her prior known occlusive disease in her extracranial cerebrovascular circulation she was reassured this discussion will see us again on an as-needed basis    Alyssa Yoder [facility] Office: [phone]

## 2013-10-10 ENCOUNTER — Other Ambulatory Visit: Payer: Self-pay

## 2013-10-10 MED ORDER — CLOPIDOGREL BISULFATE 75 MG PO TABS: ORAL_TABLET | ORAL | Status: DC

## 2013-10-29 DIAGNOSIS — Z0279 Encounter for issue of other medical certificate: Secondary | ICD-10-CM

## 2013-11-02 IMAGING — CT CT VIRTUAL COLONOSCOPY SCREENING
3 of 6 series · 13 of 36 positions shown, 19 images · non-contrast
Comparison: CT abdomen pelvis 08/29/2005 and 07/20/2004.

CLINICAL DATA: Screening, on Coumadin.  Constipation.

CT VIRTUAL COLONOSCOPY FOR SCREENING
TECHNIQUE: The patient was given a standard LoSo bowel
preparation with Gastrografin and barium for fluid and stool
tagging respectively.  The quality of the bowel preparation is
excellent.  Automated CO2 insufflation of the colon was performed
prior to image acquisition and colonic distention is excellent.
Image post processing was used to generate a 3D endoluminal fly-
through projection of the colon and to electronically subtract
stool/fluid as appropriate.

[Series 2: supine (id) · axial · 0.70mm/px · z∈[-462,-269]mm · 4 of 360 slices shown]
[im 52/360  soft-tissue]
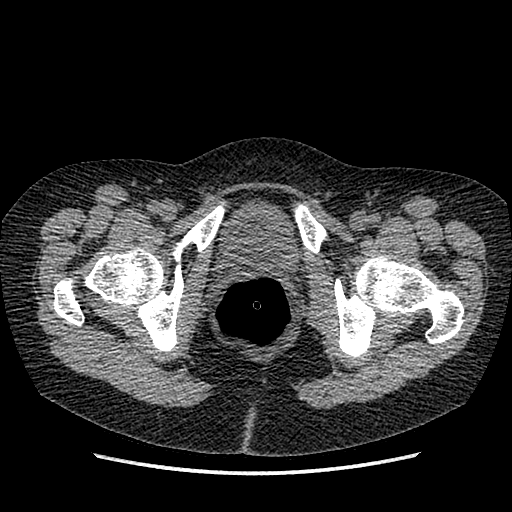
[im 103/360  soft-tissue]
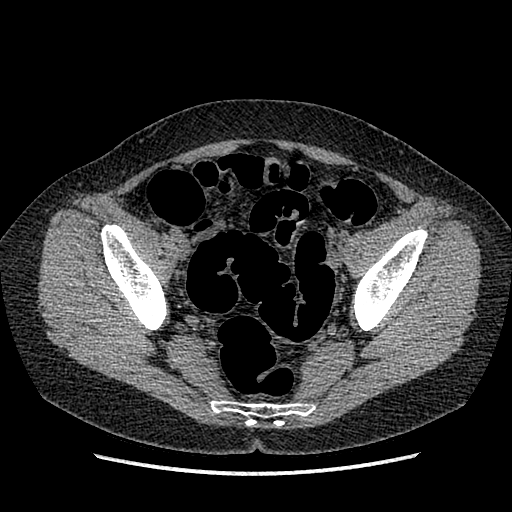
[im 154/360  soft-tissue]
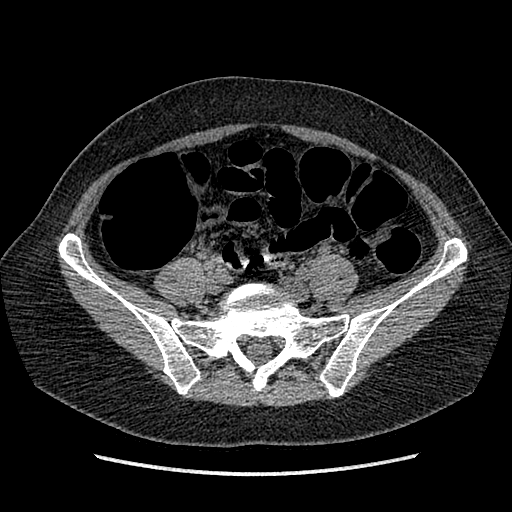
[im 206/360  soft-tissue]
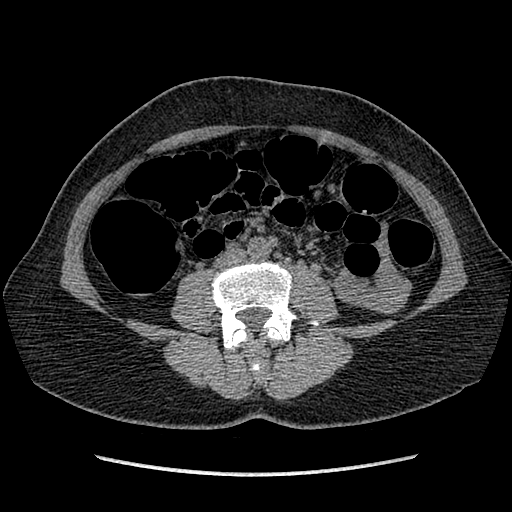

[Series 6: prone (id) · axial · 0.70mm/px · z∈[-484,-128]mm · 8 of 367 slices shown, 13 images]
[im 41/367  soft-tissue]
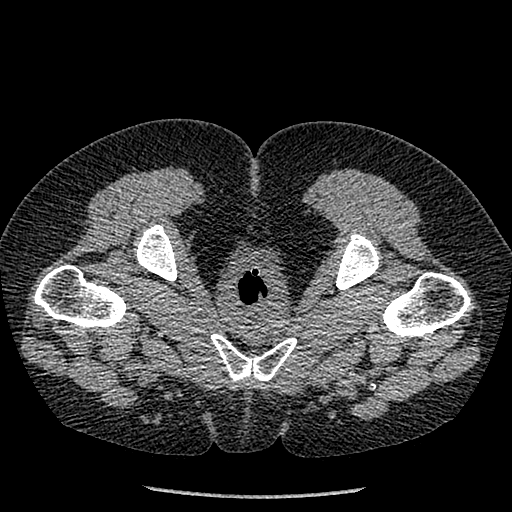
[im 41/367  bone]
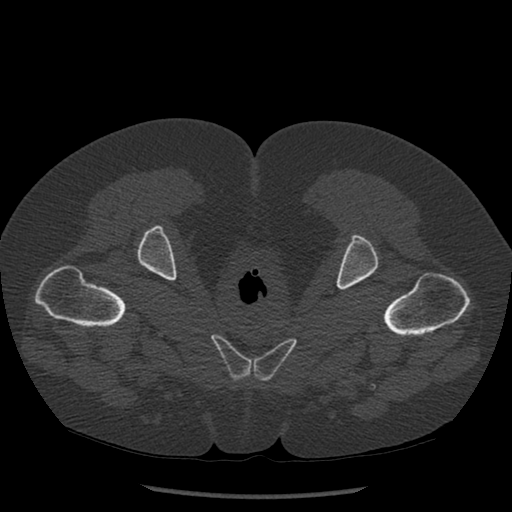
[im 82/367  soft-tissue]
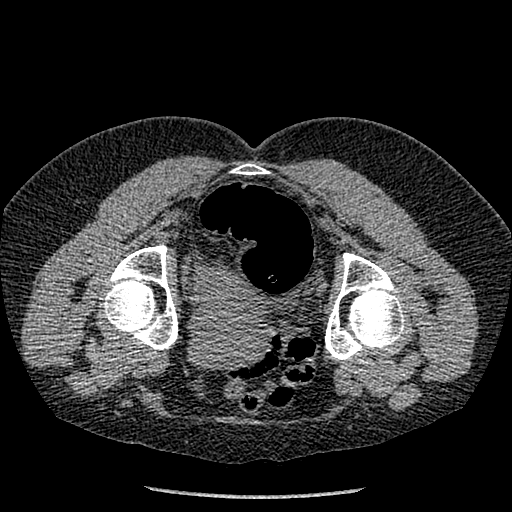
[im 123/367  soft-tissue]
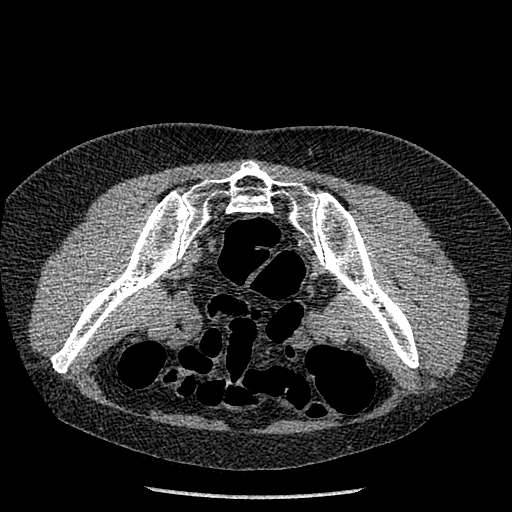
[im 163/367  soft-tissue]
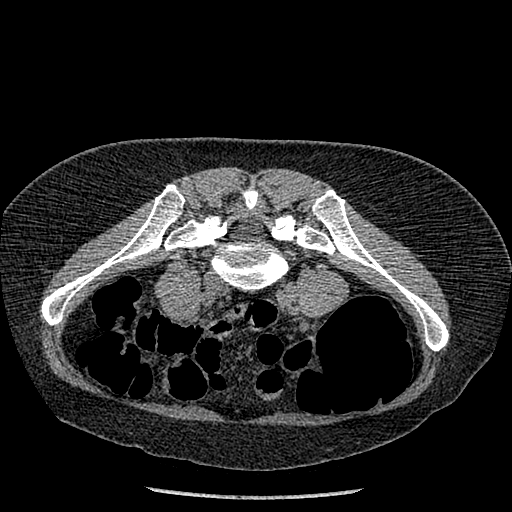
[im 204/367  soft-tissue]
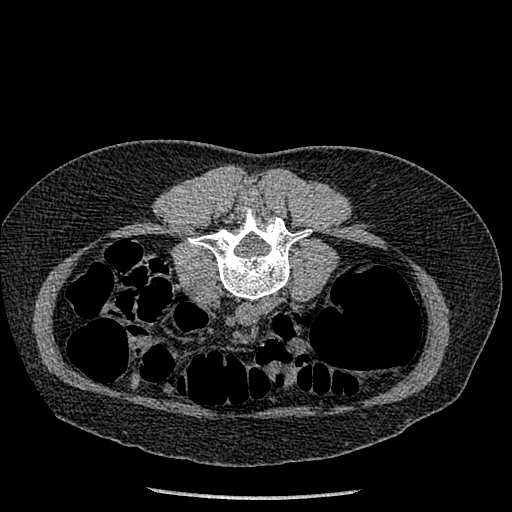
[im 204/367  lung]
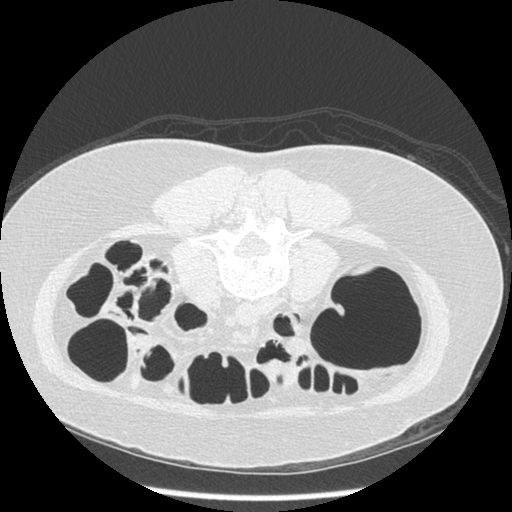
[im 245/367  soft-tissue]
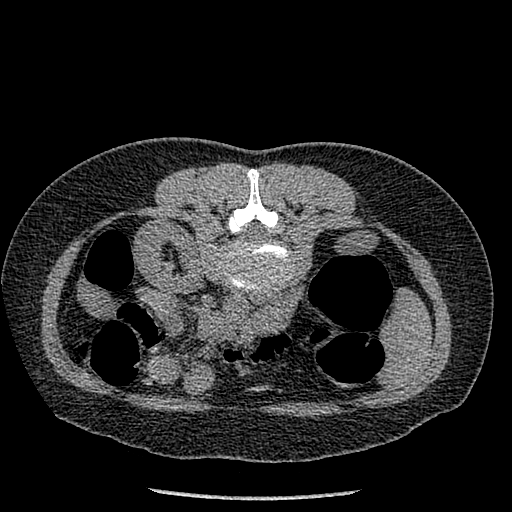
[im 245/367  lung]
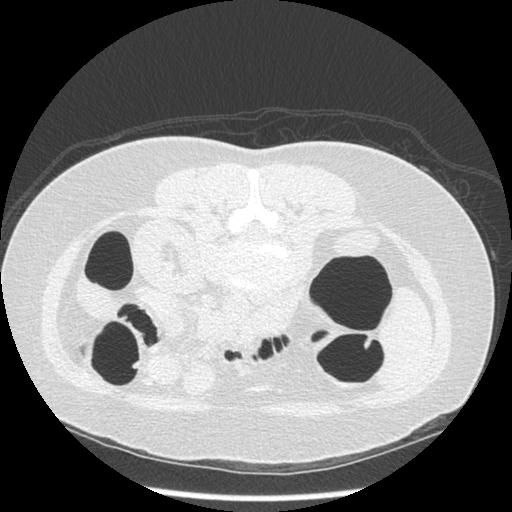
[im 285/367  soft-tissue]
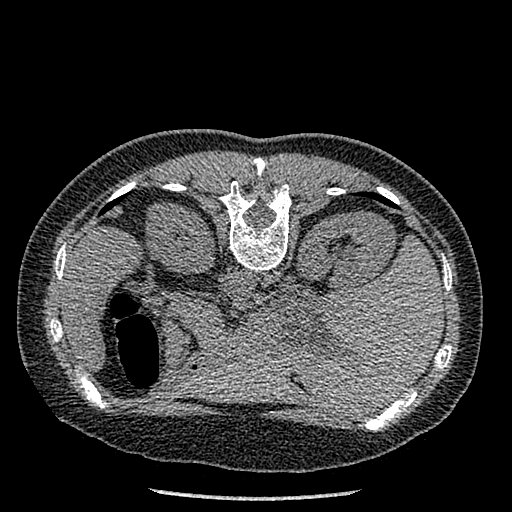
[im 285/367  lung]
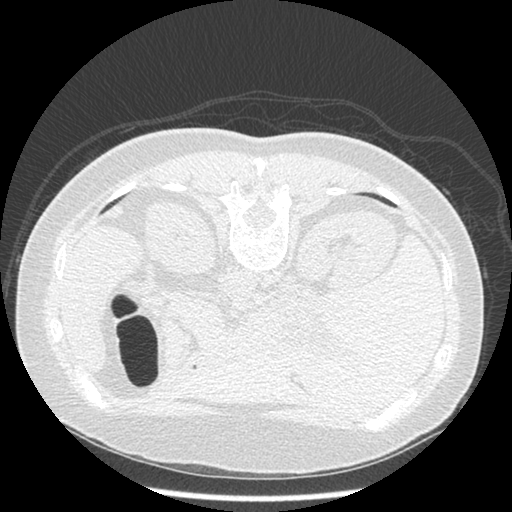
[im 326/367  soft-tissue]
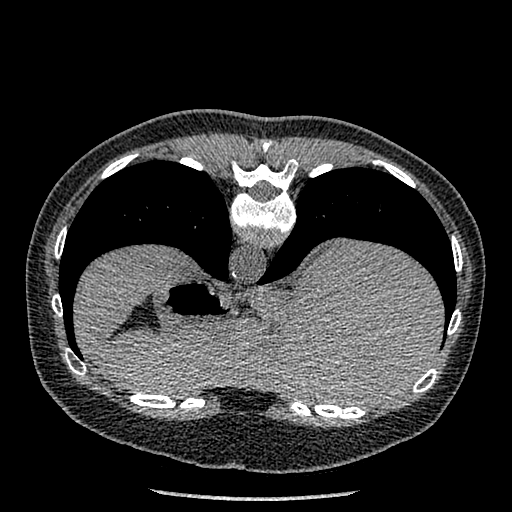
[im 326/367  lung]
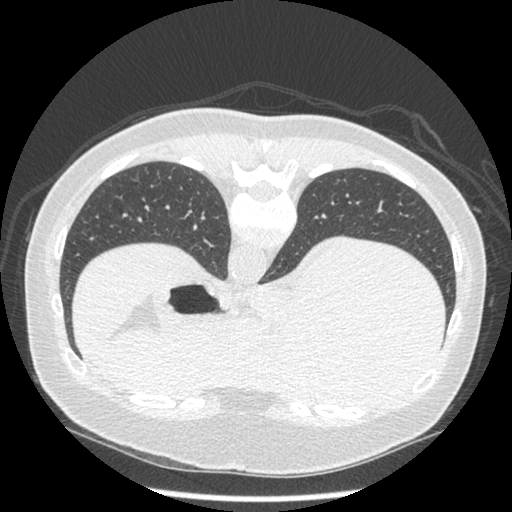

[Series 601: coronal body · coronal · 0.88mm/px · 1 of 112 slices shown, 2 images]
[im 38/112  soft-tissue]
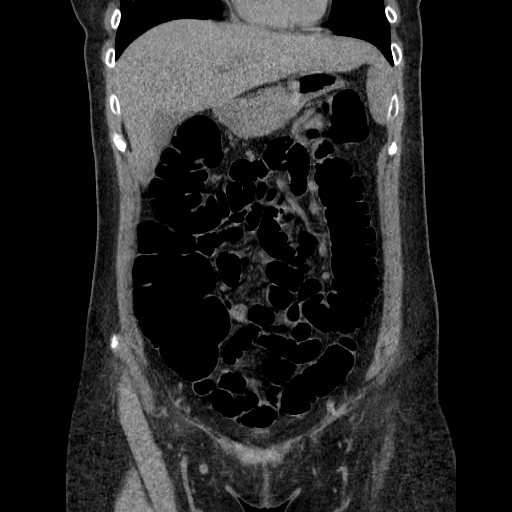
[im 38/112  bone]
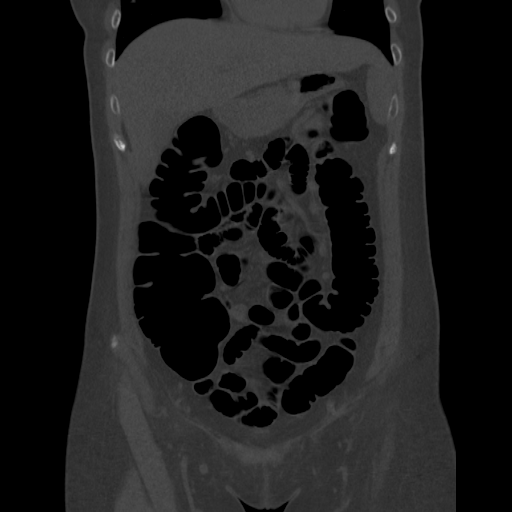

[13 of 36 positions shown; findings below may reference images not displayed]

FINDINGS: No polyp, stricture or mass.
IMPRESSION: No polyp, stricture or mass.

Virtual colonoscopy is not designed to detect diminutive polyps
(i.e., less than or equal to 5 mm), the presence or absence of
which may not affect clinical management.

CT ABDOMEN AND PELVIS WITHOUT CONTRAST
FINDINGS: Lung bases show clustered nodularity in the lateral
right middle lobe, likely post infectious in etiology.  Lung bases
are otherwise unremarkable.  No pericardial or pleural effusion.

Liver, gallbladder, adrenal glands, kidneys, spleen, pancreas,
stomach and small bowel are unremarkable.  Colon is discussed in
the virtual colonoscopy section of this report.  Atherosclerotic
calcification of the arterial vasculature without abdominal aortic
aneurysm.  No pathologically enlarged lymph nodes.  No free fluid.
No worrisome lytic or sclerotic lesions.
IMPRESSION: No acute findings.

## 2014-06-18 ENCOUNTER — Encounter: Payer: Self-pay | Admitting: *Deleted

## 2014-06-19 ENCOUNTER — Ambulatory Visit (INDEPENDENT_AMBULATORY_CARE_PROVIDER_SITE_OTHER): Payer: No Typology Code available for payment source | Admitting: Neurology

## 2014-06-19 ENCOUNTER — Encounter: Payer: No Typology Code available for payment source | Admitting: Radiology

## 2014-06-19 ENCOUNTER — Encounter: Payer: Self-pay | Admitting: Neurology

## 2014-06-19 VITALS — BP 105/72 | HR 79 | Ht 68.0 in | Wt 188.0 lb

## 2014-06-19 DIAGNOSIS — G441 Vascular headache, not elsewhere classified: Secondary | ICD-10-CM

## 2014-06-19 DIAGNOSIS — I639 Cerebral infarction, unspecified: Secondary | ICD-10-CM

## 2014-06-19 DIAGNOSIS — R5382 Chronic fatigue, unspecified: Secondary | ICD-10-CM

## 2014-06-19 DIAGNOSIS — R5383 Other fatigue: Secondary | ICD-10-CM | POA: Insufficient documentation

## 2014-06-19 MED ORDER — ROSUVASTATIN CALCIUM 5 MG PO TABS: 2.5000 mg | ORAL_TABLET | Freq: Every day | ORAL | Status: DC

## 2014-06-19 MED ORDER — MODAFINIL 100 MG PO TABS: 100.0000 mg | ORAL_TABLET | Freq: Every day | ORAL | Status: DC

## 2014-06-19 NOTE — Patient Instructions (Signed)
I had a long discussion with the patient was regards to symptoms of chronic fatigue, discussed differential diagnosis, plan for evaluation and answered questions. Check EMG nerve conduction study with rapid repetitive stimulation for myasthenia as well as acetylcholine receptor antibodies. Trial of Provigil 100 mg at known to see if it helps her fatigue. She was advised to take Tylenol Extra Strength or ibuprofen for symptomatic relief of her intermittent vascular headaches. If this is not effective may try low-dose narcotics later. I have specifically advised her to stay away from trip pounds and vasoconstrictive headache medications even her complex neurovascular status. I also advised her to start taking Crestor 2.5 mg daily for her elevated LDL. Continue Plavix for stroke prevention. Unfortunately she is not a candidate for further neurovascular surgery reconstruction given her prior complicated Takayashu`s disease with failed reconstructive surgery x 2. Chronic Fatigue Syndrome Chronic fatigue syndrome (CFS) is a condition in which there is lasting, extreme tiredness (fatigue) that does not improve with rest. CFS affects women up to four times more often than men. If you have CFS, fatigue and other symptoms can make it hard for you to get through your day. There is no treatment or cure. You will need to work closely with your health care provider to come up with a treatment plan that works for you. CAUSES  No one knows what causes CFS. It may be triggered by a flu-like illness or by mono. Other triggers may include:  An abnormal immune system.  Low blood pressure.  Poor diet.  Physical or emotional stress. SIGNS AND SYMPTOMS The main symptom is fatigue that lasts all day, especially after physical or mental stress. Other common symptoms include:  An extreme loss of energy with no obvious cause.  Muscle or joint soreness.  Severe weakness.  Frequent headaches.  Fever.  Sore  throat.  Swollen lymph glands.  Sleep is not refreshing.  Loss of concentration or memory. Less common symptoms may include:  Chills.  Night sweats.  Tingling or numbness.  Blurred vision.  Dizziness.  Sensitivity to noise or odors.  Mood swings.  Anxiety, panic attacks, and depression. Your symptoms may come and go, or you may have them all the time. DIAGNOSIS  There are no tests that can help health care providers diagnose CFS. It may take a long time for you to get a correct diagnosis. Your health care provider may need to do a number of tests to rule out other conditions that could be causing your symptoms. You may be diagnosed with CFS if:  You have fatigue that has lasted for at least six months.  Your fatigue is not relieved by rest.  Your fatigue is not caused by another condition.  Your fatigue is severe enough to interfere with work and daily activities.  You have at least four common symptoms of CFS. TREATMENT  There is no cure for CFS at this time. The condition affects everyone differently. You will need to work with your health care provider to find the best treatment for your symptoms. Treatment may include:  Improving sleep with a regular bedtime routine.  Avoiding caffeine, alcohol, and tobacco.  Doing light exercise and stretching during the day.  Taking medicine to help you sleep.  Taking over-the-counter medicines to relieve joint or muscle pain.  Learning and practicing relaxation techniques.  Using memory aids or doing brain teasers to improve memory and concentration.  Seeing a mental health professional to evaluate and treat depression, if necessary.  Trying  massage therapy, acupuncture, and movement exercises, like yoga or tai chi. [place] Work closely with your health care provider to follow your treatment plan at home. You may need to make major lifestyle changes. If treatment does not seem to help, get a second  opinion. You may get help from many health care providers, including doctors, mental health specialists, physical therapists, and rehabilitation therapists. Having the support of friends and loved ones is also important. SEEK MEDICAL CARE IF:  Your symptoms are not responding to treatment.  You are having strong feelings of anger, guilt, anxiety, or depression. Document Released: 06/23/2004 Document Revised: 09/30/2013 Document Reviewed: 04/05/2013 [facility] Patient Information 2015 [place], [place]. This information is not intended to replace advice given to you by your health care provider. Make sure you discuss any questions you have with your health care provider.

## 2014-06-19 NOTE — Progress Notes (Signed)
[facility] [address] [place]. [place] [address] [phone]       OFFICE CONSULT NOTE  [name] Date of Birth:  05-01-1961 Medical Record Number:  [id number]   Referring MD:  Laurann Montana, MD  Reason for Referral:  Headaches  HPI: 54 year old Caucasian lady with a comminuted past medical history of back issues arteritis with failed carotid bypass surgery in 1989 done in [place] followed by repeat surgery with reconstruction using posterior headache in 1993 at [facility] which also reoccluded in 1996. She had a small stroke with some mild 70% left vertebral artery occlusion and dilated and large right vertebral artery which is the dominant supply to the brain. She has had intermittent symptoms of dizziness with neck extension since then but has not had any syncopal episodes of further stroke or neurovascular symptoms. She has had had symptoms of chronic fatigue and tiredness which seem to be more prominent towards the afternoon and evening time. She was seen by me in 2009 for the above symptoms and was lost to follow-up. In October 2015 she started having intermittent sharp jabbing pains involving the left face and temples which occurred on a variable frequency. She also has a dull pressure-like right occipital headache as well. Both these symptoms seem to be more aggravated with exertion as well as towards late afternoon and evening. The symptoms are relieved by lying down. Rarely she needs to take Tylenol or Motrin which does help. She has in fact learned to do most of her activities in the morning time. She does not in fact when driving the evening and spends October 2 day resting. She was recently evaluated by vascular surgeon Dr. Arbie Cookey who felt no revascularization options were remaining and recommended conservative treatment. She takes an aspirin as well as takes soy protein and supplements which helped with her energy level. She denies any drooping of  eyelids, diplopia, trouble swallowing but does say that her eyes group was another day and her arms and shoulders feel heavy and weak and she has excessive fatigue.  ROS:   14 system review of systems is positive for weight gain, fatigue, palpitations, leg swelling, trouble swallowing, memory loss, headache, weakness, feeling hot, runny nose and all systems negative   PMH:  Past Medical History  Diagnosis Date  . Takayasu's arteritis   . Mesenteric ischemia   . Stroke   . Fibrocystic breast disease   . Cluster headaches   . Hypothyroidism   . Hypercholesterolemia     Social History:  History   Social History  . Marital Status: Married    Spouse Name: N/A    Number of Children: 2  . Years of Education: 12+   Occupational History  . Not on file.   Social History Main Topics  . Smoking status: Never Smoker   . Smokeless tobacco: Never Used  . Alcohol Use: No  . Drug Use: No  . Sexual Activity: Not on file   Other Topics Concern  . Not on file   Social History Narrative   Patient is married with 2 children.   Patient is right handed.   Patient has hs education.   Patient drinks 2 cups daily.    Medications:   Current Outpatient Prescriptions on File Prior to Visit  Medication Sig Dispense Refill  . aspirin 325 MG tablet Take 325 mg by mouth daily.    . clopidogrel (PLAVIX) 75 MG tablet TAKE 1 TABLET BY MOUTH EVERY DAY 90  tablet 0  . Multiple Vitamin (MULTIVITAMIN) tablet Take 2 tablets by mouth daily.     . Nutritional Supplements (SOY PROTEIN SHAKE) POWD Take by mouth 2 (two) times daily.      Marland Kitchen. thyroid (ARMOUR) 120 MG tablet Take 120 mg by mouth daily before breakfast.     No current facility-administered medications on file prior to visit.    Allergies:   Allergies  Allergen Reactions  . Pravachol [Pravastatin Sodium] Other (See Comments)    FATIGUE  . Shellfish-Derived Products     Physical Exam General: well developed, well nourished, seated, in no  evident distress Head: head normocephalic and atraumatic.   Neck: supple with bilateral scars from prior surgery Cardiovascular: regular rate and rhythm, no murmurs Musculoskeletal: no deformity. Skin:  no rash/petichiae Vascular:  Absent left radial and brachial pulses. Blood pressure cannot be obtained in the left arm. Bilateral neck carotid bruits  Neurologic Exam Mental Status: Awake and fully alert. Oriented to place and time. Recent and remote memory intact. Attention span, concentration and fund of knowledge appropriate. Mood and affect appropriate.  Cranial Nerves: Fundoscopic exam reveals sharp disc margins. Pupils equal, briskly reactive to light. Extraocular movements full without nystagmus. No drooping of the eyelids addressed but upon sustained upgaze for 1 minute there is mild Visual fields full to confrontation. Hearing intact. Facial sensation intact. Face, tongue, palate moves normally and symmetrically.  Motor: Normal bulk and tone. Normal strength in all tested extremity muscles. Sensory.: intact to touch , pinprick , position and vibratory sensation.  Coordination: Rapid alternating movements normal in all extremities. Finger-to-nose and heel-to-shin performed accurately bilaterally. Gait and Station: Arises from chair without difficulty. Stance is normal. Gait demonstrates normal stride length and balance . Able to heel, toe and tandem walk without difficulty.  Reflexes: 1+ and symmetric. Toes downgoing.      ASSESSMENT: [age] year Caucasian lady with  transient and recurrent paroxysmal headaches likely of vascular origin but benign in nature in a patient with long-standing history of Takayushu`s arteritis status post aortic subclavian bypass with reocclusion and repeat revascularization with prosthettic grafts which have also occluded. 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    PLAN: I had a long discussion with the patient was regards to symptoms of chronic fatigue, discussed differential diagnosis, plan for evaluation and answered questions. Check EMG nerve conduction study with rapid repetitive stimulation for myasthenia as well as acetylcholine receptor antibodies. Trial of Provigil 100 mg at known to see if it helps her fatigue. She was advised to take Tylenol Extra Strength or ibuprofen for symptomatic relief of her intermittent vascular headaches. If this is not effective may try low-dose narcotics later. I have specifically advised her to stay away from trip pounds and vasoconstrictive headache medications even her complex neurovascular status. I also advised her to start taking Crestor 2.5 mg daily for her elevated LDL. Continue Plavix for stroke prevention. Unfortunately she is not a candidate for further neurovascular surgery reconstruction given her prior complicated Takayashu`s disease with failed reconstructive surgery x 2.  Note: This document was prepared with digital dictation and possible smart phrase technology. Any transcriptional errors that result from this process are unintentional.

## 2014-06-23 ENCOUNTER — Other Ambulatory Visit (INDEPENDENT_AMBULATORY_CARE_PROVIDER_SITE_OTHER): Payer: Self-pay

## 2014-06-23 DIAGNOSIS — Z0289 Encounter for other administrative examinations: Secondary | ICD-10-CM

## 2014-06-26 LAB — ACETYLCHOLINE RECEPTOR, BLOCKING: ACHR BLOCKING ABS, SERUM: 22 % (ref 0–25)

## 2014-06-26 LAB — ACETYLCHOLINE RECEPTOR, BINDING: ACHR BINDING AB, SERUM: 0.03 nmol/L (ref 0.00–0.24)

## 2014-06-30 ENCOUNTER — Encounter: Payer: Self-pay | Admitting: Neurology

## 2014-07-04 ENCOUNTER — Encounter: Payer: No Typology Code available for payment source | Admitting: Radiology

## 2014-07-04 ENCOUNTER — Encounter: Payer: No Typology Code available for payment source | Admitting: Neurology

## 2014-07-31 ENCOUNTER — Telehealth: Payer: Self-pay | Admitting: Neurology

## 2014-07-31 NOTE — Telephone Encounter (Signed)
Patient stated Insurance has requested a letter stating why patient needs brand name for Rx rosuvastatin (CRESTOR) 5 MG tablet instead of generic.  [facility] pharmacist found a coupon for 30 day supply with no cost to patient, but medication is almost out.  Also questioning when she have chlolesterol levels checked again?   Please call and advise.

## 2014-07-31 NOTE — Telephone Encounter (Signed)
Ins has been contacted asking that they grant an exception for Crestor.  Request is under review.  Patient questioning when her levels need to be checked again.  Please advise.  Thank you.

## 2014-08-02 ENCOUNTER — Other Ambulatory Visit: Payer: Self-pay | Admitting: Neurology

## 2014-08-02 DIAGNOSIS — E7849 Other hyperlipidemia: Secondary | ICD-10-CM

## 2014-08-02 NOTE — Telephone Encounter (Signed)
I will order lipid profile.I presume she has been on crestor since mu last offcie visit 06/19/14

## 2014-08-04 ENCOUNTER — Telehealth: Payer: Self-pay

## 2014-08-04 MED ORDER — ATORVASTATIN CALCIUM 10 MG PO TABS: 10.0000 mg | ORAL_TABLET | Freq: Every day | ORAL | Status: DC

## 2014-08-04 NOTE — Telephone Encounter (Signed)
Ok to try generic lipitor 10 mg or zocor 20 mg if not tried

## 2014-08-04 NOTE — Telephone Encounter (Signed)
Start with 1 tab daily first and call back if myalgias

## 2014-08-04 NOTE — Telephone Encounter (Signed)
Alyssa Yoder has denied the request for coverage on Crestor.  They indicate the patient must have a documented trial and failure of two of the following medications: Atorvastatin, Lovastatin and/or Simvastatin.  Would you like to change to a formulary alternative?  Please advise.  Thank you.

## 2014-08-04 NOTE — Telephone Encounter (Signed)
Rx has been sent.  I called the patient back.  Relayed info.  She verbalized understanding and will call us back if anything further is needed.

## 2014-08-04 NOTE — Telephone Encounter (Signed)
I spoke with patient who is agreeable to try alternate medication.  She was previously on Crestor 5mg  one half tab daily.  Should new Rx also be for one half tab daily or one full tab daily?  Please advise.  Thank you.

## 2014-09-25 ENCOUNTER — Encounter: Payer: Self-pay | Admitting: Neurology

## 2014-09-25 ENCOUNTER — Ambulatory Visit (INDEPENDENT_AMBULATORY_CARE_PROVIDER_SITE_OTHER): Payer: No Typology Code available for payment source | Admitting: Neurology

## 2014-09-25 VITALS — BP 110/72 | HR 88 | Wt 178.6 lb

## 2014-09-25 DIAGNOSIS — G7000 Myasthenia gravis without (acute) exacerbation: Secondary | ICD-10-CM

## 2014-09-25 MED ORDER — PYRIDOSTIGMINE BROMIDE 60 MG PO TABS: 30.0000 mg | ORAL_TABLET | Freq: Three times a day (TID) | ORAL | Status: DC

## 2014-09-25 NOTE — Patient Instructions (Signed)
I had a long discussion with the patient with regards to her symptoms of chronic fatigue and generalized weakness in the afternoon and discussed differential diagnosis, plan for evaluation and answered questions. Unfortunately she is unable to afford test like EMG nerve conduction study with rapid repetitive stimulation or lab work was entirely: Except antibodies at the present time. She was also unable to afford Provigil as it is brand name. I recommend a trial of generic Mestinon 30 mg 3 times daily for 1 week increase if tolerated to 60 mg 3 times daily. I have discussed side effects with the patient and advised her to call me if necessary. She will continue Plavix for stroke prevention and stay on Lipitor and I recommend she have follow-up lipid profile and thyroid function tests at next visit with her primary physician. She will return for follow-up in 6 months or call earlier if necessary. Fatigue Fatigue is a feeling of tiredness, lack of energy, lack of motivation, or feeling tired all the time. Having enough rest, good nutrition, and reducing stress will normally reduce fatigue. Consult your caregiver if it persists. The nature of your fatigue will help your caregiver to find out its cause. The treatment is based on the cause.  CAUSES  There are many causes for fatigue. Most of the time, fatigue can be traced to one or more of your habits or routines. Most causes fit into one or more of three general areas. They are: Lifestyle problems  Sleep disturbances.  Overwork.  Physical exertion.  Unhealthy habits.  Poor eating habits or eating disorders.  Alcohol and/or drug use .  Lack of proper nutrition (malnutrition). Psychological problems  Stress and/or anxiety problems.  Depression.  Grief.  Boredom. Medical Problems or Conditions  Anemia.  Pregnancy.  Thyroid gland problems.  Recovery from major surgery.  Continuous pain.  Emphysema or asthma that is not well  controlled  Allergic conditions.  Diabetes.  Infections (such as mononucleosis).  Obesity.  Sleep disorders, such as sleep apnea.  Heart failure or other heart-related problems.  Cancer.  Kidney disease.  Liver disease.  Effects of certain medicines such as antihistamines, cough and cold remedies, prescription pain medicines, heart and blood pressure medicines, drugs used for treatment of cancer, and some antidepressants. SYMPTOMS  The symptoms of fatigue include:   Lack of energy.  Lack of drive (motivation).  Drowsiness.  Feeling of indifference to the surroundings. DIAGNOSIS  The details of how you feel help guide your caregiver in finding out what is causing the fatigue. You will be asked about your present and past health condition. It is important to review all medicines that you take, including prescription and non-prescription items. A thorough exam will be done. You will be questioned about your feelings, habits, and normal lifestyle. Your caregiver may suggest blood tests, urine tests, or other tests to look for common medical causes of fatigue.  TREATMENT  Fatigue is treated by correcting the underlying cause. For example, if you have continuous pain or depression, treating these causes will improve how you feel. Similarly, adjusting the dose of certain medicines will help in reducing fatigue.  HOME CARE INSTRUCTIONS   Try to get the required amount of good sleep every night.  Eat a healthy and nutritious diet, and drink enough water throughout the day.  Practice ways of relaxing (including yoga or meditation).  Exercise regularly.  Make plans to change situations that cause stress. Act on those plans so that stresses decrease over time. Keep  your work and personal routine reasonable.  Avoid street drugs and minimize use of alcohol.  Start taking a daily multivitamin after consulting your caregiver. SEEK MEDICAL CARE IF:   You have persistent tiredness,  which cannot be accounted for.  You have fever.  You have unintentional weight loss.  You have headaches.  You have disturbed sleep throughout the night.  You are feeling sad.  You have constipation.  You have dry skin.  You have gained weight.  You are taking any new or different medicines that you suspect are causing fatigue.  You are unable to sleep at night.  You develop any unusual swelling of your legs or other parts of your body. SEEK IMMEDIATE MEDICAL CARE IF:   You are feeling confused.  Your vision is blurred.  You feel faint or pass out.  You develop severe headache.  You develop severe abdominal, pelvic, or back pain.  You develop chest pain, shortness of breath, or an irregular or fast heartbeat.  You are unable to pass a normal amount of urine.  You develop abnormal bleeding such as bleeding from the rectum or you vomit blood.  You have thoughts about harming yourself or committing suicide.  You are worried that you might harm someone else. MAKE SURE YOU:   Understand these instructions.  Will watch your condition.  Will get help right away if you are not doing well or get worse. Document Released: 03/13/2007 Document Revised: 08/08/2011 Document Reviewed: 09/17/2013 [facility] Patient Information 2015 [place], [place]. This information is not intended to replace advice given to you by your health care provider. Make sure you discuss any questions you have with your health care provider.

## 2014-09-25 NOTE — Progress Notes (Signed)
[facility] [address] [place]. [place] [address] [phone]       OFFICE FOLLOW UP VISIT NOTE  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 She had a small stroke with some mild 70% left vertebral artery occlusion and dilated and large right vertebral artery which is the dominant supply to the brain. She has had intermittent symptoms of dizziness with neck extension since then but has not had any syncopal episodes of further stroke or neurovascular symptoms. She has had had symptoms of chronic fatigue and tiredness which seem to be more prominent towards the afternoon and evening time. She was seen by me in 2009 for the above symptoms and was lost to follow-up. In October 2015 she started having intermittent sharp jabbing pains involving the left face and temples which occurred on a variable frequency. She also has a dull pressure-like right occipital headache as well. Both these symptoms seem to be more aggravated with exertion as well as towards late afternoon and evening. The symptoms are relieved by lying down. Rarely she needs to take Tylenol or Motrin which does help. She has in fact learned to do most of her activities in the morning time. She does not in fact when driving the evening and spends October 2 day resting. She was recently evaluated by vascular surgeon Dr. Arbie Yoder who felt no revascularization options were remaining and recommended conservative treatment. She takes an aspirin as well as takes soy protein and supplements which helped with her energy level. She denies any  drooping of eyelids, diplopia, trouble swallowing but does say that her eyes group was another day and her arms and shoulders feel heavy and weak and she has excessive fatigue. Update 09/25/2014 : She returns for follow-up after last visit with me 3 months ago. The patient was unable to afford EMG nerve conduction study or lab work for  acetylcholine receptor antibodies or Provigil and hence did not carry out any of my instructions. She states that she is using a holistic approach to her problems and she has learned to rest in the afternoon to overcome of fatigue. She does most of her exertion and worse in the morning hours when she is okay. By 1:00 pm she feels tired and weak all over and can barely open her eyes and at times even has trouble swallowing and eating. She feels her headaches are much better and she has not had any severe headache episode since last visit. Her insurance company changed to Crestor to genetic atorvastatin 10 mg which she has been able to tolerate. She plans to get lipid profile and thyroid function tests next week during her visit with her primary physician. She is tolerating Plavix well without bleeding bruising or other side effects. ROS:   14 system review of systems is positive for weight gain, fatigue,  , trouble swallowing, memory loss, headache, weakness, feeling hot, runny nose and all systems negative   PMH:  Past Medical History  Diagnosis Date  . Takayasu's arteritis   . Mesenteric ischemia   . Stroke   . Fibrocystic breast disease   .  Cluster headaches   . Hypothyroidism   . Hypercholesterolemia     Social History:  History   Social History  . Marital Status: Married    Spouse Name: N/A  . Number of Children: 2  . Years of Education: 12+   Occupational History  . Not on file.   Social History Main Topics  . Smoking status: Never Smoker   . Smokeless tobacco: Never Used  . Alcohol Use: No  . Drug Use: No  . Sexual Activity: Not on file    Other Topics Concern  . Not on file   Social History Narrative   Patient is married with 2 children.   Patient is right handed.   Patient has hs education.   Patient drinks 2 cups daily.    Medications:   Current Outpatient Prescriptions on File Prior to Visit  Medication Sig Dispense Refill  . ALREX 0.2 % SUSP Place into both eyes daily.  1  . aspirin 325 MG tablet Take 325 mg by mouth daily.    Marland Kitchen atorvastatin (LIPITOR) 10 MG tablet Take 1 tablet (10 mg total) by mouth daily. 30 tablet 3  . clopidogrel (PLAVIX) 75 MG tablet TAKE 1 TABLET BY MOUTH EVERY DAY 90 tablet 0  . Multiple Vitamin (MULTIVITAMIN) tablet Take 2 tablets by mouth daily.     . Nutritional Supplements (SOY PROTEIN SHAKE) POWD Take by mouth 2 (two) times daily.      Marland Kitchen thyroid (ARMOUR) 120 MG tablet Take 120 mg by mouth daily before breakfast.     No current facility-administered medications on file prior to visit.    Allergies:   Allergies  Allergen Reactions  . Pravachol [Pravastatin Sodium] Other (See Comments)    FATIGUE  . Shellfish-Derived Products     Physical Exam General: well developed, well nourished, seated, in no evident distress Head: head normocephalic and atraumatic.   Neck: supple with bilateral scars from prior surgery Cardiovascular: regular rate and rhythm, no murmurs Musculoskeletal: no deformity. Skin:  no rash/petichiae Vascular:  Absent left radial and brachial pulses. Blood pressure cannot be obtained in the left arm. Bilateral neck carotid bruits  Neurologic Exam Mental Status: Awake and fully alert. Oriented to place and time. Recent and remote memory intact. Attention span, concentration and fund of knowledge appropriate. Mood and affect appropriate. Mini-Mental status exam scored 29/30. With only one deficit and following 3 step commands. Geriatric depression scale not depressed. Clock drawing 4/4. Animal naming test 12. Cranial Nerves: Fundoscopic exam reveals sharp disc  margins. Pupils equal, briskly reactive to light. Extraocular movements full without nystagmus. No drooping of the eyelids addressed but upon sustained upgaze for 1 minute there is mild Visual fields full to confrontation. Hearing intact. Facial sensation intact. Face, tongue, palate moves normally and symmetrically.  Motor: Normal bulk and tone. Normal strength in all tested extremity muscles. Sensory.: intact to touch , pinprick , position and vibratory sensation.  Coordination: Rapid alternating movements normal in all extremities. Finger-to-nose and heel-to-shin performed accurately bilaterally. Gait and Station: Arises from chair without difficulty. Stance is normal. Gait demonstrates normal stride length and balance . Able to heel, toe and tandem walk without difficulty.  Reflexes: 1+ and symmetric. Toes downgoing.      ASSESSMENT: 69 year Caucasian lady with  transient and recurrent paroxysmal headaches likely of vascular origin but benign in nature in a patient with long-standing history of Takayushu`s arteritis status post aortic subclavian bypass with reocclusion and repeat revascularization with prosthettic grafts  which have also occluded. 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 Recent complaints of chronic fatigue, generalized weakness of unclear etiology. Myasthenia gravis is a possibility but patient unable to afford diagnostic tests    PLAN:  I had a long discussion with the patient with regards to her symptoms of chronic fatigue and generalized weakness in the afternoon and discussed differential diagnosis, plan for evaluation and answered questions. Unfortunately she is unable to afford test like EMG nerve conduction study with rapid repetitive stimulation or lab work was entirely: Except antibodies at the present time. She was also unable to afford Provigil as it is  brand name. I recommend a trial of generic Mestinon 30 mg 3 times daily for 1 week increase if tolerated to 60 mg 3 times daily. I have discussed side effects with the patient and advised her to call me if necessary. She will continue Plavix for stroke prevention and stay on Lipitor and I recommend she have follow-up lipid profile and thyroid function tests at next visit with her primary physician. She will return for follow-up in 6 months or call earlier if necessary  Delia HeadyPramod Yanessa Hocevar, MD  Note: This document was prepared with digital dictation and possible smart phrase technology. Any transcriptional errors that result from this process are unintentional.

## 2014-09-25 NOTE — Addendum Note (Signed)
Addended by: [place] PinkHESSON, Aurielle Slingerland C on: 09/25/2014 11:22 AM   Modules accepted: Medications

## 2014-12-11 ENCOUNTER — Other Ambulatory Visit: Payer: Self-pay | Admitting: Neurology

## 2015-03-26 ENCOUNTER — Ambulatory Visit (INDEPENDENT_AMBULATORY_CARE_PROVIDER_SITE_OTHER): Payer: No Typology Code available for payment source | Admitting: Neurology

## 2015-03-26 ENCOUNTER — Encounter: Payer: Self-pay | Admitting: Neurology

## 2015-03-26 VITALS — BP 112/72 | HR 70 | Ht 69.0 in | Wt 180.2 lb

## 2015-03-26 DIAGNOSIS — R5383 Other fatigue: Secondary | ICD-10-CM | POA: Diagnosis not present

## 2015-03-26 NOTE — Progress Notes (Signed)
[facility] [address] [place]. [place] [address] [phone]       OFFICE FOLLOW UP VISIT NOTE  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 She had a small stroke with some mild 70% left vertebral artery occlusion and dilated and large right vertebral artery which is the dominant supply to the brain. She has had intermittent symptoms of dizziness with neck extension since then but has not had any syncopal episodes of further stroke or neurovascular symptoms. She has had had symptoms of chronic fatigue and tiredness which seem to be more prominent towards the afternoon and evening time. She was seen by me in 2009 for the above symptoms and was lost to follow-up. In October 2015 she started having intermittent sharp jabbing pains involving the left face and temples which occurred on a variable frequency. She also has a dull pressure-like right occipital headache as well. Both these symptoms seem to be more aggravated with exertion as well as towards late afternoon and evening. The symptoms are relieved by lying down. Rarely she needs to take Tylenol or Motrin which does help. She has in fact learned to do most of her activities in the morning time. She does not in fact when driving the evening and spends October 2 day resting. She was recently evaluated by vascular surgeon Dr. Arbie Cookey who felt no revascularization options were remaining and recommended conservative treatment. She takes an aspirin as well as takes soy protein and supplements which helped with her energy level. She denies any  drooping of eyelids, diplopia, trouble swallowing but does say that her eyes group was another day and her arms and shoulders feel heavy and weak and she has excessive fatigue. Update 09/25/2014 : She returns for follow-up after last visit with me 3 months ago. The patient was unable to afford EMG nerve conduction study or lab work for  acetylcholine receptor antibodies or Provigil and hence did not carry out any of my instructions. She states that she is using a holistic approach to her problems and she has learned to rest in the afternoon to overcome of fatigue. She does most of her exertion and worse in the morning hours when she is okay. By 1:00 pm she feels tired and weak all over and can barely open her eyes and at times even has trouble swallowing and eating. She feels her headaches are much better and she has not had any severe headache episode since last visit. Her insurance company changed to Crestor to genetic atorvastatin 10 mg which she has been able to tolerate. She plans to get lipid profile and thyroid function tests next week during her visit with her primary physician. She is tolerating Plavix well without bleeding bruising or other side effects. Update 03/26/2015 : She returns for follow-up after last visit 6 months ago. She states she continues to have fatigue and headaches but she is learned to live with it. She has to pace herself and restrict most of activities during the daytime. She had tried Mestinon for couple of weeks in regular basis when she was visiting her parents in the  summer and felt it was helping but now she is taking it only on an as-needed basis. She has not been able to afford EMG nerve conduction study or lab work for myasthenia. She continues to have headaches but these are mainly pressure-like tightness sensation in her temples which develops in the afternoon and she cannot do a lot of work. She has not been doing regular neck stretching exercises of stress relaxation  activities. ROS:   14 system review of systems is positive for weight gain, fatigue, , eye redness, light sensitive, double vision, leg swelling, cold intolerance, excessive thirst, neck pain and stiffness, headaches, food allergies, memory loss, numbness, weakness and all systems negative   PMH:  Past Medical History  Diagnosis Date  . Takayasu's arteritis (HCC)   . Mesenteric ischemia   . Stroke (HCC)   . Fibrocystic breast disease   . Cluster headaches   . Hypothyroidism   . Hypercholesterolemia     Social History:  Social History   Social History  . Marital Status: Married    Spouse Name: N/A  . Number of Children: 2  . Years of Education: 12+   Occupational History  . Not on file.   Social History Main Topics  . Smoking status: Never Smoker   . Smokeless tobacco: Never Used  . Alcohol Use: No  . Drug Use: No  . Sexual Activity: Not on file   Other Topics Concern  . Not on file   Social History Narrative   Patient is married with 2 children.   Patient is right handed.   Patient has hs education.   Patient drinks 2 cups daily.    Medications:   Current Outpatient Prescriptions on File Prior to Visit  Medication Sig Dispense Refill  . aspirin 325 MG tablet Take 325 mg by mouth daily.    . clopidogrel (PLAVIX) 75 MG tablet TAKE 1 TABLET BY MOUTH EVERY DAY 90 tablet 0  . Multiple Vitamin (MULTIVITAMIN) tablet Take 2 tablets by mouth daily.     . Nutritional Supplements (SOY PROTEIN SHAKE) POWD Take by mouth 2 (two) times daily.      Marland Kitchen. pyridostigmine (MESTINON) 60 MG tablet Take 0.5 tablets (30 mg total) by mouth 3 (three) times daily. Start 1/2 tablet three times daily x 1 week with food and increase to one tablet three times daily as tolerated 90 tablet 1  . thyroid (ARMOUR) 120 MG tablet Take 120 mg by mouth daily before breakfast.     No current facility-administered medications on file prior to visit.    Allergies:   Allergies  Allergen Reactions  .  Pravachol [Pravastatin Sodium] Other (See Comments)    FATIGUE  . Shellfish-Derived Products     Physical Exam General: well developed, well nourished, seated, in no evident distress Head: head normocephalic and atraumatic.   Neck: supple with bilateral scars from prior surgery Cardiovascular: regular rate and rhythm, no murmurs Musculoskeletal: no deformity. Skin:  no rash/petichiae Vascular:  Absent left radial and brachial pulses. Blood pressure cannot be obtained in the left arm. Bilateral neck carotid bruits  Neurologic Exam Mental Status: Awake and fully alert. Oriented to place and time. Recent and remote memory intact. Attention span, concentration and fund of knowledge appropriate. Mood and affect appropriate. Mini-Mental status exam  not done Cranial Nerves: Fundoscopic exam not done . Pupils equal, briskly reactive to light. Extraocular movements full without nystagmus. No drooping of the eyelids addressed but upon sustained upgaze for 1 minute  there is mild Visual fields full to confrontation. Hearing intact. Facial sensation intact. Face, tongue, palate moves normally and symmetrically.  Motor: Normal bulk and tone. Normal strength in all tested extremity muscles. Sensory.: intact to touch , pinprick , position and vibratory sensation.  Coordination: Rapid alternating movements normal in all extremities. Finger-to-nose and heel-to-shin performed accurately bilaterally. Gait and Station: Arises from chair without difficulty. Stance is normal. Gait demonstrates normal stride length and balance . Able to heel, toe and tandem walk without difficulty.  Reflexes: 1+ and symmetric. Toes downgoing.      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 Recent complaints of chronic fatigue, generalized weakness of unclear etiology. Myasthenia gravis is a possibility but patient unable to afford diagnostic tests and I am unsure if mestinon is helping as she takes it as prn only.    PLAN:  I had a long discussion with the patient regarding her symptoms of fatigue and headaches. She feels she may have had some response to Mestinon however she has not been taking it on a scheduled basis and hence I cannot tell. I recommend she take Mestinon 60 mg 3 times daily with meals regularly for at least a couple of weeks. She condition responds and if there is no benefit then stop and she has benefited she continues it. She is unfortunately not able to afford no conduction study or lab work for myasthenia. I also gave her  neck stretching exercises to be done regularly to help with the tension headaches. She was advised to return for follow-up in the future only as necessary and no scheduled appointment was ma Delia Heady, MD  Note: This document was prepared with digital dictation and possible smart phrase technology. Any transcriptional errors that result from this process are unintentional.

## 2015-03-26 NOTE — Patient Instructions (Addendum)
I had a long discussion with the patient regarding her symptoms of fatigue and headaches. She feels she may have had some response to Mestinon however she has not been taking it on a scheduled basis and hence I cannot tell. I recommend she take Mestinon 60 mg 3 times daily with meals regularly for at least a couple of weeks. She condition responds and if there is no benefit then stop and she has benefited she continues it. She is unfortunately not able to afford no conduction study or lab work for myasthenia. I also gave her  neck stretching exercises to be done regularly to help with the tension headaches. She was advised to return for follow-up in the future only as necessary and no scheduled appointment was made.

## 2015-03-30 ENCOUNTER — Telehealth: Payer: Self-pay

## 2015-03-30 NOTE — Telephone Encounter (Signed)
Rn talk to patient about copies of her office visit notes from Dr.Sethi. Patient sign a release form to request her office visit. Rn also print office notes from centricity in the previous charting system. Rn stated to patient the office visit notes will be at the front desk for pick up.

## 2015-04-13 ENCOUNTER — Other Ambulatory Visit: Payer: Self-pay

## 2015-04-13 DIAGNOSIS — Z1231 Encounter for screening mammogram for malignant neoplasm of breast: Secondary | ICD-10-CM

## 2015-05-01 ENCOUNTER — Encounter: Payer: Self-pay | Admitting: *Deleted

## 2015-05-04 NOTE — Progress Notes (Signed)
Patient ID: Alyssa Yoder J Yoder, female   DOB: [birth year], 54 y.o.   MRN: [id number]   Aram BeechamCynthia is referred back by Dr. Laurann Montanaynthia White. 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 [facility] and has been on chronic coumdan. 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 She has been seen by Dr. Arbie CookeyEarly who essentially told her, that he wouldn't touch her case and she would need to be referred to an academic center if her symptoms worsened. She had a bout of headaches requiring hospitalizaiton in 07/2007. Earlier this year she had some self limited palpitations. She actually points to her neck area when she describes her palpitations. 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 She has not had any SSCP, syncope, TIA or dyspnea. She does not have known involvement of the distal Ao or legs. 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  2. 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  3. Dominant right vertebral artery as described above.    Rash on neck and hands at times   05/10/13  Normal ETT  ROS: Denies fever, malais, weight loss, blurry vision, decreased visual acuity, cough, sputum, SOB, hemoptysis, pleuritic pain, palpitaitons, heartburn, abdominal pain, melena, lower extremity edema, claudication, or rash.  All other systems reviewed and negative  General: Decreased BP in left arm and pulse in radial on left  Affect  appropriate Healthy:  appears stated age HEENT: normal small rash on neck and right hand  Neck supple with no adenopathy JVP normal bilateral bruits with surgical scars in neck no thyromegaly Lungs clear with no wheezing and good diaphragmatic motion Heart:  S1/S2 no murmur, no rub, gallop or click PMI normal Abdomen: benighn, BS positve, no tenderness, no AAA no bruit.  No HSM or HJR Distal pulses intact with no bruits No edema Neuro non-focal Skin warm and dry No muscular weakness   Current Outpatient Prescriptions  Medication Sig Dispense Refill  . aspirin 325 MG tablet Take 325 mg by mouth daily.    . Multiple Vitamin (MULTIVITAMIN) tablet Take 2 tablets by mouth daily.     . Nutritional Supplements (SOY PROTEIN SHAKE) POWD Take 1 scoop by mouth 2 (two) times daily.     Marland Kitchen. thyroid (ARMOUR) 120 MG tablet Take 120 mg by mouth daily before breakfast.    . clopidogrel (PLAVIX) 75 MG tablet TAKE 1 TABLET BY MOUTH EVERY DAY 90 tablet 3   No current facility-administered medications for this visit.    Allergies  Pravachol and Shellfish-derived products  Electrocardiogram:   04/26/13  SR Rate 76 normal  05/07/15  SR rate 78 normal   Assessment and Plan  Carotid Disease: minimal dizzyness so long as she takes soy protein.  F/U VVS Thyroid: continue armor replacement No results found for: TSH Rash:  Etiology not clear PRN benedryl f/u derm  Charlton HawsPeter Asees Manfredi

## 2015-05-05 ENCOUNTER — Ambulatory Visit
Admission: RE | Admit: 2015-05-05 | Discharge: 2015-05-05 | Disposition: A | Discharge status: Home / Self Care | Payer: No Typology Code available for payment source | Source: Ambulatory Visit

## 2015-05-05 DIAGNOSIS — Z1231 Encounter for screening mammogram for malignant neoplasm of breast: Secondary | ICD-10-CM

## 2015-05-07 ENCOUNTER — Encounter: Payer: Self-pay | Admitting: Cardiovascular Disease

## 2015-05-07 ENCOUNTER — Ambulatory Visit (INDEPENDENT_AMBULATORY_CARE_PROVIDER_SITE_OTHER): Payer: No Typology Code available for payment source | Admitting: Cardiovascular Disease

## 2015-05-07 ENCOUNTER — Other Ambulatory Visit: Payer: Self-pay | Admitting: Family Medicine

## 2015-05-07 VITALS — BP 120/70 | HR 78 | Ht 68.0 in | Wt 178.8 lb

## 2015-05-07 DIAGNOSIS — R928 Other abnormal and inconclusive findings on diagnostic imaging of breast: Secondary | ICD-10-CM

## 2015-05-07 DIAGNOSIS — R0600 Dyspnea, unspecified: Secondary | ICD-10-CM | POA: Diagnosis not present

## 2015-05-07 MED ORDER — CLOPIDOGREL BISULFATE 75 MG PO TABS: ORAL_TABLET | ORAL | Status: AC

## 2015-05-07 NOTE — Patient Instructions (Signed)

## 2015-05-14 ENCOUNTER — Ambulatory Visit
Admission: RE | Admit: 2015-05-14 | Discharge: 2015-05-14 | Disposition: A | Discharge status: Home / Self Care | Payer: No Typology Code available for payment source | Source: Ambulatory Visit | Attending: Family Medicine | Admitting: Family Medicine

## 2015-05-14 DIAGNOSIS — R928 Other abnormal and inconclusive findings on diagnostic imaging of breast: Secondary | ICD-10-CM

## 2023-12-29 DIAGNOSIS — H524 Presbyopia: Secondary | ICD-10-CM | POA: Diagnosis not present

## 2023-12-29 DIAGNOSIS — D3131 Benign neoplasm of right choroid: Secondary | ICD-10-CM | POA: Diagnosis not present

## 2024-01-25 DIAGNOSIS — E2839 Other primary ovarian failure: Secondary | ICD-10-CM | POA: Diagnosis not present

## 2024-01-25 DIAGNOSIS — Z1231 Encounter for screening mammogram for malignant neoplasm of breast: Secondary | ICD-10-CM | POA: Diagnosis not present

## 2024-01-25 DIAGNOSIS — R92323 Mammographic fibroglandular density, bilateral breasts: Secondary | ICD-10-CM | POA: Diagnosis not present

## 2024-01-25 DIAGNOSIS — M8589 Other specified disorders of bone density and structure, multiple sites: Secondary | ICD-10-CM | POA: Diagnosis not present

## 2024-01-25 DIAGNOSIS — M85851 Other specified disorders of bone density and structure, right thigh: Secondary | ICD-10-CM | POA: Diagnosis not present

## 2024-03-07 DIAGNOSIS — B079 Viral wart, unspecified: Secondary | ICD-10-CM | POA: Diagnosis not present

## 2024-03-07 DIAGNOSIS — L821 Other seborrheic keratosis: Secondary | ICD-10-CM | POA: Diagnosis not present

## 2024-04-10 DIAGNOSIS — B079 Viral wart, unspecified: Secondary | ICD-10-CM | POA: Diagnosis not present

## 2024-05-08 DIAGNOSIS — B079 Viral wart, unspecified: Secondary | ICD-10-CM | POA: Diagnosis not present
